# Patient Record
Sex: Female | Born: 1990 | Hispanic: No | Marital: Single | Smoking: Former smoker
Health system: Southern US, Community
[De-identification: ages and names within clinical notes are randomized; demographics above are authoritative.]

## PROBLEM LIST (undated history)

## (undated) DIAGNOSIS — K649 Unspecified hemorrhoids: Secondary | ICD-10-CM

## (undated) DIAGNOSIS — K602 Anal fissure, unspecified: Secondary | ICD-10-CM

## (undated) DIAGNOSIS — D649 Anemia, unspecified: Secondary | ICD-10-CM

## (undated) DIAGNOSIS — F32A Depression, unspecified: Secondary | ICD-10-CM

## (undated) DIAGNOSIS — Z789 Other specified health status: Secondary | ICD-10-CM

## (undated) DIAGNOSIS — K219 Gastro-esophageal reflux disease without esophagitis: Secondary | ICD-10-CM

## (undated) DIAGNOSIS — F419 Anxiety disorder, unspecified: Secondary | ICD-10-CM

## (undated) DIAGNOSIS — J939 Pneumothorax, unspecified: Secondary | ICD-10-CM

## (undated) HISTORY — DX: Anxiety disorder, unspecified: F41.9

## (undated) HISTORY — DX: Anemia, unspecified: D64.9

## (undated) HISTORY — DX: Depression, unspecified: F32.A

## (undated) HISTORY — DX: Unspecified hemorrhoids: K64.9

## (undated) HISTORY — DX: Other specified health status: Z78.9

## (undated) HISTORY — DX: Gastro-esophageal reflux disease without esophagitis: K21.9

## (undated) HISTORY — DX: Anal fissure, unspecified: K60.2

---

## 2012-06-10 DIAGNOSIS — K219 Gastro-esophageal reflux disease without esophagitis: Secondary | ICD-10-CM

## 2012-06-10 HISTORY — DX: Gastro-esophageal reflux disease without esophagitis: K21.9

## 2017-07-30 ENCOUNTER — Emergency Department
Admission: EM | Admit: 2017-07-30 | Discharge: 2017-07-30 | Disposition: A | Discharge status: Home / Self Care | Payer: Medicaid Other | Attending: Emergency Medicine | Admitting: Emergency Medicine

## 2017-07-30 DIAGNOSIS — K644 Residual hemorrhoidal skin tags: Secondary | ICD-10-CM | POA: Insufficient documentation

## 2017-07-30 DIAGNOSIS — K649 Unspecified hemorrhoids: Secondary | ICD-10-CM

## 2017-07-30 DIAGNOSIS — F17210 Nicotine dependence, cigarettes, uncomplicated: Secondary | ICD-10-CM | POA: Insufficient documentation

## 2017-07-30 MED ORDER — HYDROCORTISONE ACETATE 25 MG RE SUPP
25.00 mg | Freq: Two times a day (BID) | RECTAL | 0 refills | Status: AC
Start: 2017-07-30 — End: ?

## 2017-07-30 NOTE — Discharge Instructions (Signed)
Suppositories twice daily as needed.  Continue colace.  Increase fluids.  Follow-up with colorectal surgeon ASAP.        Hemorrhoids    You have been diagnosed with hemorrhoids.    Hemorrhoids are swollen veins around the anus (rectum). Veins are blood vessels that carry blood to the heart. Straining to move the bowels causes most hemorrhoids. Hemorrhoids often happen during pregnancy. They may also be a complication of liver disease. Hemorrhoids can be external (outside the rectum) or internal (inside the rectum). External hemorrhoids may feel like a lump near the anus. Some hemorrhoids have blood clots inside; these are very painful. You may see red blood on your toilet paper, in the toilet bowl or on your stool (poop).    Hemorrhoids are often treated with stool softeners, which make bowel movements easier. Your doctor may recommend a cream or suppository (medicine put in the rectum) to help with swelling. Your doctor may also recommend pain medicine. Use all medicines as prescribed.    Take a hot "sitz bath" for at least 15 minutes, 3-4 times a day and after each bowel movement to help the pain. It will also help to keep the anus clean. Keeping the area clean is VERY IMPORTANT to help the hemorrhoid heal.   TO TAKE A SITZ BATH: Fill the bath tub with a few inches of hot water. The water SHOULD NOT be so hot that it causes discomfort. Sit in the water. Briskly swish water against your anus to clean off any stool around the area. This will also soothe the irritated skin. Do this for about 15 minutes.    Increase the fiber or bulk in your diet. Choose foods high in fiber like fruits, vegetables and whole grain breads. Your doctor may also recommend a fiber supplement.   Don't sit on the toilet too long. No reading or relaxing!   Do not strain (push too hard)    Follow up as directed. You may be referred to a surgeon for further evaluation of your hemorrhoids.    YOU SHOULD SEEK MEDICAL ATTENTION  IMMEDIATELY, EITHER HERE OR AT THE NEAREST EMERGENCY DEPARTMENT, IF ANY OF THE FOLLOWING OCCURS:   The pain suddenly gets worse.   Repeated vomiting (throwing up), or vomiting blood or material that looks like coffee grounds.   Your stool has blood, gets very dark or looks like tar.

## 2017-07-31 NOTE — ED Provider Notes (Signed)
EMERGENCY DEPARTMENT NOTE    Physician/Midlevel provider first contact with patient: 07/30/17 2249         HISTORY OF PRESENT ILLNESS   Historian:Patient  Translator Used: No    Chief Complaint: Hemorrhoids     27 y.o. female presents for evaluation of hemorrhoids.  States she's had hemorrhoids for over a year and has been seeing a specialist in [place], [place].  Last visit about 3 weeks ago when she had 2 internal hemorrhoids banded.  States one band fell out the day of the procedure.  Doesn't know when second one fell out.  States she's supposed to follow-up with the specialist but came to the ED today because she wants a second opinion.  Reports she is taking colace as directed as well as applying an external compound cream.  States she has pain with each BM, worse today and pain lasted for some time after finishing the BM.      LMP: 07/17/17    1. Location of symptoms: rectum  2. Onset of symptoms: about 1.5 years ago  3. What was patient doing when symptoms started (Context): see above  4. Severity: moderate  5. Timing: intermittenly  6. Activities that worsen symptoms: BM  7. Activities that improve symptoms: nothing  8. Quality: tender  9. Radiation of symptoms: no  10. Associated signs and Symptoms: see above  11. Are symptoms worsening? yes  MEDICAL HISTORY     Past Medical History:  Past Medical History:   Diagnosis Date   . Anal fissure    . Hemorrhoid        Past Surgical History:  History reviewed. No pertinent surgical history.    Social History:  Social History     Social History   . Marital status: Single     Spouse name: N/A   . Number of children: N/A   . Years of education: N/A     Occupational History   . Not on file.     Social History Main Topics   . Smoking status: Current Every Day Smoker     Packs/day: 0.50   . Smokeless tobacco: Never Used   . Alcohol use Yes   . Drug use: No   . Sexual activity: Not on file     Other Topics Concern   . Not on file     Social History Narrative   . No narrative  on file       Family History:  History reviewed. No pertinent family history.    Outpatient Medication:  Discharge Medication List as of 07/30/2017 11:06 PM      CONTINUE these medications which have NOT CHANGED    Details   acetaminophen (TYLENOL) 650 MG suppository Place 650 mg rectally every 4 (four) hours as needed for Fever., Historical Med      docusate sodium (COLACE) 100 MG capsule Take 100 mg by mouth 2 (two) times daily., Historical Med      ibuprofen (ADVIL,MOTRIN) 200 MG tablet Take 200 mg by mouth every 6 (six) hours as needed for Pain., Historical Med               REVIEW OF SYSTEMS   Review of Systems   Constitutional: Negative for fever.   Genitourinary:        Hemorrhoids   Allergic/Immunologic: Negative for immunocompromised state.   All other systems reviewed and are negative.      PHYSICAL EXAM     ED Triage Vitals [07/30/17  2228]   Enc Vitals Group      BP 109/70      Heart Rate 78      Resp Rate 16      Temp 99.5 F (37.5 C)      Temp src       SpO2 99 %      Weight 71.3 kg      Height 1.676 m      Head Circumference       Peak Flow       Pain Score 0      Pain Loc       Pain Edu?       Excl. in GC?        BP 109/70   Pulse 78   Temp 99.5 F (37.5 C)   Resp 16   Ht 5\' 6"  (1.676 m)   Wt 71.3 kg   LMP 07/17/2017   SpO2 99%   BMI 25.37 kg/m       Nursing note and vitals reviewed.  Constitutional:  Well developed, well nourished. Awake & Oriented x3.  Head:  Atraumatic. Normocephalic.    Eyes:  Lids and conjunctivae are normal  ENT:  Mucous membranes are moist and intact.   Neck:  Supple. Full ROM.    Cardiovascular:  Regular rate.   Pulmonary/Chest:  No evidence of respiratory distress.   Abdominal:  Soft and non-distended. There is no tenderness.   Back:  Full ROM.   Extremities:  Moves all extremities.  Skin:  Skin is warm and dry.  No diaphoresis. No rash.   Neurological:  Alert, awake, and appropriate. Normal speech. Motor normal.  Psychiatric:  Good eye contact. Normal interaction,  affect, and behavior.    Physical Exam   Genitourinary: Rectal exam shows external hemorrhoid (not engorged or thrombosed) and internal hemorrhoid.       MEDICAL DECISION MAKING     DISCUSSION      Patient having internal pain, has not been using any internal medication/suppositories recently.  Has only been using external cream.  Will rx Anusol suppositories.  Recommend follow-up with a new colorectal surgeon for second opinion about treatment plan.  Patient feels comfortable with discharge home and verbalized understanding of all discharge instructions and follow-up care.      Vital Signs: Reviewed the patient?s vital signs.   Nursing Notes: Reviewed and utilized available nursing notes.  Medical Records Reviewed: Reviewed available past medical records.  Counseling: The emergency provider has spoken with the patient and discussed today?s findings, in addition to providing specific details for the plan of care.  Questions are answered and there is agreement with the plan.        RADIOLOGY IMAGING STUDIES      No orders to display         PULSE OXIMETRY    Oxygen Saturation by Pulse Oximetry: 99%  Interventions: none  Interpretation:  Normal    EMERGENCY DEPT. MEDICATIONS      ED Medication Orders     None          LABORATORY RESULTS    Ordered and independently interpreted AVAILABLE laboratory tests. Please see results section in chart for full details.  No results found for this or any previous visit.    CRITICAL CARE/PROCEDURES    Procedures    DIAGNOSIS      Diagnosis:  Final diagnoses:   Hemorrhoids, unspecified hemorrhoid type       Disposition:  ED  Disposition     ED Disposition Condition Date/Time Comment    Discharge  Wed Jul 30, 2017 11:06 PM [name] discharge to home/self care.    Condition at disposition: Stable          Prescriptions:  Discharge Medication List as of 07/30/2017 11:06 PM      START taking these medications    Details   hydrocortisone (ANUSOL-HC) 25 MG suppository Place 1  suppository (25 mg total) rectally 2 (two) times daily., Starting Wed 07/30/2017, Print         CONTINUE these medications which have NOT CHANGED    Details   acetaminophen (TYLENOL) 650 MG suppository Place 650 mg rectally every 4 (four) hours as needed for Fever., Historical Med      docusate sodium (COLACE) 100 MG capsule Take 100 mg by mouth 2 (two) times daily., Historical Med      ibuprofen (ADVIL,MOTRIN) 200 MG tablet Take 200 mg by mouth every 6 (six) hours as needed for Pain., Historical Med                  Dora Sims, FNP  07/31/17 [phone]

## 2018-08-14 ENCOUNTER — Emergency Department
Admission: EM | Admit: 2018-08-14 | Discharge: 2018-08-14 | Disposition: A | Discharge status: Home / Self Care | Payer: Medicaid Other | Attending: Emergency Medicine | Admitting: Emergency Medicine

## 2018-08-14 ENCOUNTER — Emergency Department: Payer: Medicaid Other

## 2018-08-14 DIAGNOSIS — N939 Abnormal uterine and vaginal bleeding, unspecified: Secondary | ICD-10-CM

## 2018-08-14 DIAGNOSIS — F17210 Nicotine dependence, cigarettes, uncomplicated: Secondary | ICD-10-CM | POA: Insufficient documentation

## 2018-08-14 LAB — CBC
Absolute NRBC: 0 10*3/uL (ref 0.00–0.00)
Hematocrit: 39.3 % (ref 34.7–43.7)
Hgb: 12.9 g/dL (ref 11.4–14.8)
MCH: 31 pg (ref 25.1–33.5)
MCHC: 32.8 g/dL (ref 31.5–35.8)
MCV: 94.5 fL (ref 78.0–96.0)
MPV: 9.8 fL (ref 8.9–12.5)
Nucleated RBC: 0 /100 WBC (ref 0.0–0.0)
Platelets: 306 10*3/uL (ref 142–346)
RBC: 4.16 10*6/uL (ref 3.90–5.10)
RDW: 13 % (ref 11–15)
WBC: 7.27 10*3/uL (ref 3.10–9.50)

## 2018-08-14 LAB — PT/INR
PT INR: 1 (ref 0.9–1.1)
PT: 12.7 s (ref 12.6–15.0)

## 2018-08-14 LAB — GFR: EGFR: >60

## 2018-08-14 LAB — COMPREHENSIVE METABOLIC PANEL
ALT: 23 U/L (ref 0–55)
AST (SGOT): 16 U/L (ref 5–34)
Albumin/Globulin Ratio: 1 (ref 0.9–2.2)
Albumin: 3.5 g/dL (ref 3.5–5.0)
Alkaline Phosphatase: 117 U/L — ABNORMAL HIGH (ref 37–106)
Anion Gap: 11 (ref 5.0–15.0)
BUN: 15 mg/dL (ref 7–19)
Bilirubin, Total: 0.5 mg/dL (ref 0.2–1.2)
CO2: 21 mEq/L — ABNORMAL LOW (ref 22–29)
Calcium: 9.4 mg/dL (ref 8.5–10.5)
Chloride: 109 mEq/L (ref 100–111)
Creatinine: 0.8 mg/dL (ref 0.6–1.0)
Globulin: 3.6 g/dL (ref 2.0–3.6)
Glucose: 80 mg/dL (ref 70–100)
Potassium: 4 mEq/L (ref 3.5–5.1)
Protein, Total: 7.1 g/dL (ref 6.0–8.3)
Sodium: 141 mEq/L (ref 136–145)

## 2018-08-14 MED ORDER — IBUPROFEN 600 MG PO TABS
600.00 mg | ORAL_TABLET | Freq: Once | ORAL | Status: AC
Start: 2018-08-14 — End: 2018-08-14
  Administered 2018-08-14: 22:00:00 600 mg via ORAL
  Filled 2018-08-14: qty 1

## 2018-08-14 MED ORDER — KETOROLAC TROMETHAMINE 30 MG/ML IJ SOLN
30.00 mg | Freq: Once | INTRAMUSCULAR | Status: DC
Start: 2018-08-14 — End: 2018-08-14

## 2018-08-14 NOTE — ED Provider Notes (Signed)
EMERGENCY DEPARTMENT NOTE    Physician/Midlevel provider first contact with patient: 08/14/18 1816         HISTORY OF PRESENT ILLNESS   Historian:Patient  Translator Used: No    Chief Complaint: Vaginal Bleeding     Mechanism of Injury:       28 y.o. female G1 para 1 A0 presents for 1 week postpartum soak 2 pads this a.m. around 7 AM upon arising from bed, and since has not soaked pad since this a.m. was told by OB/GYN to follow-up with emergency department for evaluation    Patient denies fever chills headache abdomen pain dysuria back pain      1. Location of symptoms:  2. Onset of symptoms:   3. What was patient doing when symptoms started (Context): see above  4. Severity: moderate  5. Timing:intermittent  6. Activities that worsen symptoms: nothing  7. Activities that improve symptoms: nothing  8. Quality: dark blood  9. Radiation of symptoms: no  10. Associated signs and Symptoms: see above  11. Are symptoms worsening? no  MEDICAL HISTORY     Past Medical History:  Past Medical History:   Diagnosis Date    Anal fissure     Hemorrhoid        Past Surgical History:  History reviewed. No pertinent surgical history.    Social History:  Social History     Socioeconomic History    Marital status: Single     Spouse name: Not on file    Number of children: Not on file    Years of education: Not on file    Highest education level: Not on file   Occupational History    Not on file   Social Needs    Financial resource strain: Not on file    Food insecurity:     Worry: Not on file     Inability: Not on file    Transportation needs:     Medical: Not on file     Non-medical: Not on file   Tobacco Use    Smoking status: Current Every Day Smoker     Packs/day: 0.50    Smokeless tobacco: Never Used   Substance and Sexual Activity    Alcohol use: Yes    Drug use: No    Sexual activity: Not on file   Lifestyle    Physical activity:     Days per week: Not on file     Minutes per session: Not on file    Stress:  Not on file   Relationships    Social connections:     Talks on phone: Not on file     Gets together: Not on file     Attends religious service: Not on file     Active member of club or organization: Not on file     Attends meetings of clubs or organizations: Not on file     Relationship status: Not on file    Intimate partner violence:     Fear of current or ex partner: Not on file     Emotionally abused: Not on file     Physically abused: Not on file     Forced sexual activity: Not on file   Other Topics Concern    Not on file   Social History Narrative    Not on file       Family History:  History reviewed. No pertinent family history.    Outpatient Medication:  Previous  Medications    ACETAMINOPHEN (TYLENOL) 650 MG SUPPOSITORY    Place 650 mg rectally every 4 (four) hours as needed for Fever.    DOCUSATE SODIUM (COLACE) 100 MG CAPSULE    Take 100 mg by mouth 2 (two) times daily.    HYDROCORTISONE (ANUSOL-HC) 25 MG SUPPOSITORY    Place 1 suppository (25 mg total) rectally 2 (two) times daily.    IBUPROFEN (ADVIL,MOTRIN) 200 MG TABLET    Take 200 mg by mouth every 6 (six) hours as needed for Pain.         REVIEW OF SYSTEMS   Review of Systems   Gastrointestinal: Negative for abdominal pain, constipation, diarrhea, nausea and vomiting.   Genitourinary: Negative for dysuria, flank pain, frequency, hematuria and urgency.        Vaginal bleeding   Musculoskeletal: Negative for back pain, joint pain, myalgias and neck pain.   Skin: Negative for itching and rash.   Neurological: Negative for dizziness, seizures and headaches.   Psychiatric/Behavioral: Negative for depression. The patient is not nervous/anxious.         Review of Systems   Constitutional: Negative for chills, fever and malaise/fatigue.   Respiratory: Negative for cough.    Cardiovascular: Negative for chest pain and palpitations.   Gastrointestinal: Negative for nausea and vomiting.   Genitourinary: Negative for dysuria and urgency.    Musculoskeletal: Negative for back pain and myalgias.   Neurological: Negative for dizziness, loss of consciousness, weakness and headaches.   Endo/Heme/Allergies: Does not bruise/bleed easily.   All other systems reviewed and are negative.      PHYSICAL EXAM     ED Triage Vitals [08/14/18 1743]   Enc Vitals Group      BP 127/87      Heart Rate (!) 58      Resp Rate 15      Temp 97.7 F (36.5 C)      Temp src       SpO2 98 %      Weight 71 kg      Height       Head Circumference       Peak Flow       Pain Score 0      Pain Loc       Pain Edu?       Excl. in GC?    Nursing note and vitals reviewed.  Constitutional:  Well developed, well nourished. Awake & Oriented x3.  Head:  Atraumatic. Normocephalic.    Eyes:  PERRL. EOMI. Conjunctivae are not pale.  ENT:  Mucous membranes are moist and intact. Oropharynx is clear and symmetric.  Patent airway.  Neck:  Supple. Full ROM.    Cardiovascular:  Regular rate. Regular rhythm. No murmurs, rubs, or gallops.  Pulmonary/Chest:  No evidence of respiratory distress. Clear to auscultation bilaterally.  No wheezing, rales or rhonchi.   Abdominal:  Soft and non-distended. There is no tenderness. No rebound, guarding, or rigidity.  Back:  Full ROM. Nontender.  Extremities:  No edema. No cyanosis. No clubbing. Full range of motion in all extremities.  Skin:  Skin is warm and dry.  No diaphoresis. No rash.   Neurological:  Alert, awake, and appropriate. Normal speech. Motor normal.  Psychiatric:  Good eye contact. Normal interaction, affect, and behavior.  Physical Exam   Genitourinary: No labial fusion. There is no rash, tenderness, lesion or injury on the right labia. There is no rash, tenderness, lesion or injury on the  left labia.    Vaginal bleeding present.      No vaginal discharge, erythema or tenderness.   There is bleeding in the vagina. No erythema or tenderness in the vagina.    No foreign body in the vagina.      No signs of injury in the vagina.      Genitourinary  Comments: Cervical loss closed, mild amount dark of blood in vaginal vault           MEDICAL DECISION MAKING     DISCUSSION    Well-appearing no acute distress 28 year old G1 para 1 A0 presents for 1 week postpartum soak 2 pads this a.m. around 7 AM upon arising from bed, and since has not soaked pad since this a.m. was told by OB/GYN to follow-up with emergency department for evaluation    Patient denies fever chills headache abdomen pain dysuria back pain, pain at present  CBC CMP PT/INR rule out infectious or coagulopathies metabolic concerns  Pelvic exam no acute findings cervical os closed dark blood to vaginal vault mild amount  Ultrasound pelvis rule out retained products  Ultrasound results reviewed no acute findings  Repeat abdominal exam benign  Discussed with patient need for 1 dose ibuprofen,   Vaginal bleeding follow-up with OB/GYN in 1 day call on-call provider to discuss her encounter here, strict return precautions,   Discussed patient history assessment ultrasound results with Dr. Ermelinda Das.    The patient is NOT septic.  All labs and vital signs from the current visit have been reviewed and any abnormality that is present is not due to sepsis.    Vital Signs: Reviewed the patient?s vital signs.   Nursing Notes: Reviewed and utilized available nursing notes.  Medical Records Reviewed: Reviewed available past medical records.  Counseling: The emergency provider has spoken with the patient and discussed today?s findings, in addition to providing specific details for the plan of care.  Questions are answered and there is agreement with the plan.      MIPS DOCUMENTATION        CARDIAC STUDIES    The following cardiac studies were independently interpreted by the Emergency Medicine Physician.  For full cardiac study results please see chart.        EMERGENCY IMAGING STUDIES    The following imagine studies were independently interpreted by me (emergency physician):      RADIOLOGY IMAGING STUDIES      US  Pelvic with Transvaginal (no Doppler)   Final Result    No acute process. No evidence for retained products. Small   amount of clot seen within the endometrial canal..      Darnelle Maffucci, MD    08/14/2018 9:10 PM              PULSE OXIMETRY    Oxygen Saturation by Pulse Oximetry: 98%  Interventions: none  Interpretation:  wnl    EMERGENCY DEPT. MEDICATIONS      ED Medication Orders (From admission, onward)    Start Ordered     Status Ordering Provider    08/14/18 2122 08/14/18 2121  ibuprofen (ADVIL,MOTRIN) tablet 600 mg  Once     Route: Oral  Ordered Dose: 600 mg     Last MAR action:  Given Xianna Siverling A    08/14/18 2121 08/14/18 2120    Once     Route: Intravenous  Ordered Dose: 30 mg     Discontinued Onika Gudiel A  LABORATORY RESULTS    Ordered and independently interpreted AVAILABLE laboratory tests. Please see results section in chart for full details.  Results for orders placed or performed during the hospital encounter of 08/14/18   CBC without differential   Result Value Ref Range    WBC 7.27 3.10 - 9.50 x10 3/uL    Hgb 12.9 11.4 - 14.8 g/dL    Hematocrit 84.6 96.2 - 43.7 %    Platelets 306 142 - 346 x10 3/uL    RBC 4.16 3.90 - 5.10 x10 6/uL    MCV 94.5 78.0 - 96.0 fL    MCH 31.0 25.1 - 33.5 pg    MCHC 32.8 31.5 - 35.8 g/dL    RDW 13 11 - 15 %    MPV 9.8 8.9 - 12.5 fL    Nucleated RBC 0.0 0.0 - 0.0 /100 WBC    Absolute NRBC 0.00 0.00 - 0.00 x10 3/uL   Comprehensive metabolic panel   Result Value Ref Range    Glucose 80 70 - 100 mg/dL    BUN 15 7 - 19 mg/dL    Creatinine 0.8 0.6 - 1.0 mg/dL    Sodium 952 841 - 324 mEq/L    Potassium 4.0 3.5 - 5.1 mEq/L    Chloride 109 100 - 111 mEq/L    CO2 21 (L) 22 - 29 mEq/L    Calcium 9.4 8.5 - 10.5 mg/dL    Protein, Total 7.1 6.0 - 8.3 g/dL    Albumin 3.5 3.5 - 5.0 g/dL    AST (SGOT) 16 5 - 34 U/L    ALT 23 0 - 55 U/L    Alkaline Phosphatase 117 (H) 37 - 106 U/L    Bilirubin, Total 0.5 0.2 - 1.2 mg/dL    Globulin 3.6 2.0 - 3.6 g/dL    Albumin/Globulin Ratio  1.0 0.9 - 2.2    Anion Gap 11.0 5.0 - 15.0   Prothrombin time/INR   Result Value Ref Range    PT 12.7 12.6 - 15.0 sec    PT INR 1.0 0.9 - 1.1   GFR   Result Value Ref Range    EGFR >60.0        CRITICAL CARE/PROCEDURES    Procedures    DIAGNOSIS      Diagnosis:  Final diagnoses:   Abnormal vaginal bleeding       Disposition:  ED Disposition     ED Disposition Condition Date/Time Comment    Discharge  Fri Aug 14, 2018  9:26 PM [name] discharge to home/self care.    Condition at disposition: Stable          Prescriptions:  Patient's Medications   New Prescriptions    No medications on file   Previous Medications    ACETAMINOPHEN (TYLENOL) 650 MG SUPPOSITORY    Place 650 mg rectally every 4 (four) hours as needed for Fever.    DOCUSATE SODIUM (COLACE) 100 MG CAPSULE    Take 100 mg by mouth 2 (two) times daily.    HYDROCORTISONE (ANUSOL-HC) 25 MG SUPPOSITORY    Place 1 suppository (25 mg total) rectally 2 (two) times daily.    IBUPROFEN (ADVIL,MOTRIN) 200 MG TABLET    Take 200 mg by mouth every 6 (six) hours as needed for Pain.   Modified Medications    No medications on file   Discontinued Medications    No medications on file        Peterson Ao, FNP  08/14/18 2133       Larina Bras, MD  08/14/18 2154

## 2018-08-14 NOTE — Discharge Instructions (Signed)
Abnormal Vaginal Bleeding    You have been diagnosed with abnormal vaginal bleeding.    Women normally bleed when they have their periods (menstruation). Any other bleeding is called abnormal. This may include bleeding that is heavier than normal or that happens at a time when you dont normally bleed. You may have other symptoms. These could be pelvic/abdominal (belly) pain or cramps. You could also have back pain, nausea (feeling sick to your stomach) or problems urinating (peeing). You may also feel tired or have emotional disturbances.    This is a common problem. Most women have it at some time in their lives.    The cause is usually a mild change in hormone levels. The problem often goes away on its own.    Some other causes are: Changes in birth control use, too much exercise, stress, rapid weight changes, or being overweight. It is also caused by infections, thyroid problems or injury to the vagina/cervix or uterus.    The cause can also be more serious. However, this is rare. More serious causes include cancer of the cervix or uterus. Therefore, it is important to have a follow-up appointment. See your OB-GYN doctor or primary care doctor for the follow-up.    The health care professional who saw you feels it is OK for you to go home.    You may need to return here or go to the nearest Emergency Department if you develop more symptoms. An increase in symptoms might mean you are having complications from your bleeding. These may include very heavy bleeding and/or infection.    It is important to have a follow-up. See your gynecologist or family doctor in the next few days. We may contact your doctor about this abnormal vaginal bleeding evaluation. If not, be sure to tell him or her about it.   If you don't have the right follow-up care available, tell the medical staff before you go. They can help make arrangements for you.    YOU SHOULD SEEK MEDICAL ATTENTION IMMEDIATELY, EITHER HERE OR AT THE  NEAREST EMERGENCY DEPARTMENT, IF ANY OF THE FOLLOWING OCCURS:   You have worse pain in the abdomen (belly), pelvis or back.   Very large amounts of vaginal bleeding, soaking of pads/tampons (more than one pad per hour), passage of large clots.   Fever (temperature higher than 100.5F / 38C), chills, nausea, vomiting.   You're dizzy, lightheaded, or you pass out.             1. Return immediately if worse in any way.    2. Follow up with your primary medical doctor for recheck.  Return to the emergency deparment if unable to follow up as instructed for any reason.    3. If you have any issues with follow up please call the [facility] Case Manager at [phone] between 11am and 5pm Monday-Friday for assistance.       Follow-up with OB/GYN in 1 day, please call office and speak with on-call Dr. to discuss encounter today  These return immediately to emergency department if symptoms worsen or any new problem occurs

## 2019-06-11 HISTORY — PX: OTHER SURGICAL HISTORY: SHX169

## 2019-07-20 ENCOUNTER — Emergency Department: Payer: Medicaid Other

## 2019-07-20 ENCOUNTER — Inpatient Hospital Stay
Admission: EM | Admit: 2019-07-20 | Discharge: 2019-07-22 | DRG: 143 | Disposition: A | Discharge status: Home / Self Care | Payer: Medicaid Other | Attending: Internal Medicine | Admitting: Internal Medicine

## 2019-07-20 ENCOUNTER — Inpatient Hospital Stay: Payer: Medicaid Other

## 2019-07-20 DIAGNOSIS — J9311 Primary spontaneous pneumothorax: Principal | ICD-10-CM | POA: Diagnosis present

## 2019-07-20 DIAGNOSIS — R0902 Hypoxemia: Secondary | ICD-10-CM | POA: Diagnosis present

## 2019-07-20 DIAGNOSIS — F17210 Nicotine dependence, cigarettes, uncomplicated: Secondary | ICD-10-CM | POA: Diagnosis present

## 2019-07-20 DIAGNOSIS — E669 Obesity, unspecified: Secondary | ICD-10-CM | POA: Diagnosis present

## 2019-07-20 DIAGNOSIS — Z6836 Body mass index (BMI) 36.0-36.9, adult: Secondary | ICD-10-CM

## 2019-07-20 DIAGNOSIS — J939 Pneumothorax, unspecified: Secondary | ICD-10-CM | POA: Diagnosis present

## 2019-07-20 DIAGNOSIS — R0602 Shortness of breath: Secondary | ICD-10-CM

## 2019-07-20 LAB — COVID-19 (SARS-COV-2): SARS CoV 2 Overall Result: NEGATIVE

## 2019-07-20 LAB — CBC AND DIFFERENTIAL
Absolute NRBC: 0 10*3/uL (ref 0.00–0.00)
Basophils Absolute Automated: 0.04 10*3/uL (ref 0.00–0.08)
Basophils Automated: 0.8 %
Eosinophils Absolute Automated: 0.09 10*3/uL (ref 0.00–0.44)
Eosinophils Automated: 1.8 %
Hematocrit: 38.2 % (ref 34.7–43.7)
Hgb: 12.5 g/dL (ref 11.4–14.8)
Immature Granulocytes Absolute: 0.01 10*3/uL (ref 0.00–0.07)
Immature Granulocytes: 0.2 %
Lymphocytes Absolute Automated: 1.84 10*3/uL (ref 0.42–3.22)
Lymphocytes Automated: 36.4 %
MCH: 30.4 pg (ref 25.1–33.5)
MCHC: 32.7 g/dL (ref 31.5–35.8)
MCV: 92.9 fL (ref 78.0–96.0)
MPV: 9.3 fL (ref 8.9–12.5)
Monocytes Absolute Automated: 0.33 10*3/uL (ref 0.21–0.85)
Monocytes: 6.5 %
Neutrophils Absolute: 2.74 10*3/uL (ref 1.10–6.33)
Neutrophils: 54.3 %
Nucleated RBC: 0 /100 WBC (ref 0.0–0.0)
Platelets: 277 10*3/uL (ref 142–346)
RBC: 4.11 10*6/uL (ref 3.90–5.10)
RDW: 12 % (ref 11–15)
WBC: 5.05 10*3/uL (ref 3.10–9.50)

## 2019-07-20 LAB — GFR: EGFR: >60

## 2019-07-20 LAB — COMPREHENSIVE METABOLIC PANEL
ALT: 19 U/L (ref 0–55)
AST (SGOT): 16 U/L (ref 5–34)
Albumin/Globulin Ratio: 1.4 (ref 0.9–2.2)
Albumin: 4 g/dL (ref 3.5–5.0)
Alkaline Phosphatase: 104 U/L (ref 37–106)
Anion Gap: 7 (ref 5.0–15.0)
BUN: 10 mg/dL (ref 7.0–19.0)
Bilirubin, Total: 0.6 mg/dL (ref 0.2–1.2)
CO2: 26 mEq/L (ref 22–29)
Calcium: 8.9 mg/dL (ref 8.5–10.5)
Chloride: 108 mEq/L (ref 100–111)
Creatinine: 0.8 mg/dL (ref 0.6–1.0)
Globulin: 2.9 g/dL (ref 2.0–3.6)
Glucose: 82 mg/dL (ref 70–100)
Potassium: 4.2 mEq/L (ref 3.5–5.1)
Protein, Total: 6.9 g/dL (ref 6.0–8.3)
Sodium: 141 mEq/L (ref 136–145)

## 2019-07-20 LAB — HEMOLYSIS INDEX: Hemolysis Index: 6 (ref 0–18)

## 2019-07-20 LAB — TROPONIN I: Troponin I: <0.01 ng/mL (ref 0.00–0.05)

## 2019-07-20 LAB — IHS D-DIMER: D-Dimer: 0.4 ug/mL FEU (ref 0.00–0.50)

## 2019-07-20 LAB — HCG QUANTITATIVE: hCG, Quant.: <1.2

## 2019-07-20 MED ORDER — LIDOCAINE HCL 1 % IJ SOLN
10.00 mL | Freq: Once | INTRAMUSCULAR | Status: AC
Start: 2019-07-20 — End: 2019-07-20
  Administered 2019-07-20: 15:00:00 10 mL via INTRADERMAL

## 2019-07-20 MED ORDER — OXYCODONE HCL 5 MG PO TABS
5.0000 mg | ORAL_TABLET | ORAL | Status: DC | PRN
Start: 2019-07-20 — End: 2019-07-22
  Administered 2019-07-20 – 2019-07-22 (×8): 5 mg via ORAL
  Filled 2019-07-20 (×8): qty 1

## 2019-07-20 MED ORDER — LIDOCAINE VISCOUS HCL 2 % MT SOLN
10.00 mL | Freq: Once | OROMUCOSAL | Status: DC
Start: 2019-07-20 — End: 2019-07-20

## 2019-07-20 MED ORDER — ACETAMINOPHEN 325 MG PO TABS
650.0000 mg | ORAL_TABLET | Freq: Four times a day (QID) | ORAL | Status: DC | PRN
Start: 2019-07-20 — End: 2019-07-22
  Administered 2019-07-20: 650 mg via ORAL
  Filled 2019-07-20: qty 2

## 2019-07-20 MED ORDER — ALUM & MAG HYDROXIDE-SIMETH 200-200-20 MG/5ML PO SUSP
30.00 mL | Freq: Once | ORAL | Status: DC
Start: 2019-07-20 — End: 2019-07-20

## 2019-07-20 MED ORDER — MORPHINE SULFATE 4 MG/ML IJ/IV SOLN (WRAP)
4.0000 mg | Status: DC | PRN
Start: 2019-07-20 — End: 2019-07-22
  Administered 2019-07-21 – 2019-07-22 (×5): 4 mg via INTRAVENOUS
  Filled 2019-07-20 (×5): qty 1

## 2019-07-20 MED ORDER — LIDOCAINE-EPINEPHRINE 1 %-1:100000 IJ SOLN
20.00 mL | Freq: Once | INTRAMUSCULAR | Status: AC
Start: 2019-07-20 — End: 2019-07-20
  Administered 2019-07-20: 15:00:00 20 mL via INTRADERMAL
  Filled 2019-07-20: qty 20

## 2019-07-20 MED ORDER — FENTANYL CITRATE (PF) 50 MCG/ML IJ SOLN (WRAP)
50.00 ug | Freq: Once | INTRAMUSCULAR | Status: AC
Start: 2019-07-20 — End: 2019-07-20
  Administered 2019-07-20: 15:00:00 50 ug via INTRAVENOUS
  Filled 2019-07-20: qty 2

## 2019-07-20 MED ORDER — ONDANSETRON HCL 4 MG/2ML IJ SOLN
4.00 mg | INTRAMUSCULAR | Status: DC | PRN
Start: 2019-07-20 — End: 2019-07-22
  Administered 2019-07-20 – 2019-07-21 (×3): 4 mg via INTRAVENOUS
  Filled 2019-07-20 (×3): qty 2

## 2019-07-20 MED ORDER — MORPHINE SULFATE 4 MG/ML IJ/IV SOLN (WRAP)
4.0000 mg | Freq: Once | Status: AC
Start: 2019-07-20 — End: 2019-07-20
  Administered 2019-07-20: 17:00:00 4 mg via INTRAVENOUS
  Filled 2019-07-20: qty 1

## 2019-07-20 NOTE — ED Notes (Signed)
Bed: CR4  Expected date:   Expected time:   Means of arrival:   Comments:

## 2019-07-20 NOTE — H&P (Signed)
Patient Type: I     ATTENDING PHYSICIAN: Hulen Skains, MD     CHIEF COMPLAINT:  Chest pain and shortness of breath.     HISTORY OF PRESENT ILLNESS:  [name] is a 29 year old Caucasian lady who was in her usual state of  health until the morning of admission when she was brushing her teeth, when  she developed acute onset of chest pain and shortness of breath.  She  initially thought this could be due to reflux symptoms.  She took some  Alka-Seltzer and this then did not result in resolution of her symptoms  and, therefore, she presented to the emergency room for further evaluation.   On presentation to the ER, she was noted to have a primary spontaneous  pneumothorax on the right requiring chest tube placement.     PAST MEDICAL HISTORY:  1.  History of anal fissure.  2.  Hemorrhoids.      PAST SURGICAL HISTORY:  None provided.     SOCIAL HISTORY:  The patient lives with her grandmother.  She has not been in touch with her  parents for several years.  She has an 68-month-old daughter who lives with  her aunt.  She had a history of tobacco use in the past, she quit about 2  years ago.  She smoked half a packet of cigarettes a day since the age of  50, making it a 40-pack-year history of smoking.  She currently does not  actively use tobacco products.  She also quit alcohol use in the past.  She  denies recreational drug use.  She is currently enrolled in an  undergraduate study.     ALLERGIES AND DRUG SENSITIVITIES:  None.     MEDICATIONS PRIOR TO ADMISSION:  Include:  1.  Oral liquid vitamin D.    2.  Supplements.  3.  Prenatal vitamins.     REVIEW OF SYSTEMS:  A 10-point review of system was done.  Each system was reviewed.  The only  findings were prior history of menstrual cramps, but never been diagnosed  with endometriosis.     PHYSICAL EXAMINATION:  GENERAL:  Physical exam revealed an obese lady who appeared uncomfortable.  HEAD AND NECK EXAM:  Unremarkable.  LUNGS:  Auscultation of the lungs revealed  clear lung fields.  HEART:  Heart sounds 1 and 2 were heard, no gallops or rubs.  ABDOMINAL EXAM:  Revealed soft abdomen and bowel sounds are present.  No  masses are palpable.  EXTREMITIES:  Reveal no edema.  NEUROLOGIC EXAM:  Nonfocal.     LABORATORY DATA AND IMAGING STUDIES:  Chest x-ray done by portable technique revealed a moderately large right  pneumothorax.  Followup chest x-ray revealed resolution of the pneumothorax  after chest tube insertion.  Sodium 141, potassium 4.2, chloride of 108,  CO2 of 26, BUN of 10, creatinine of 0.8.  Hemoglobin 12.5, hematocrit 38.2,  white blood cell count of 5,050, platelet count of 277,000.  Glucose of 82.   Troponin was 0.01.  Beta hCG is less than 1.2.  COVID-19 test is negative.   Other liver function tests are unremarkable.     ASSESSMENT AND PLAN:  This is a young lady who presents with a spontaneous pneumothorax, most  likely a primary spontaneous pneumothorax.  Would obtain urine rapid drug  test .  Will also obtain a D-dimer to  rule out pulmonary embolism, which are possible causes of pneumothorax.   She does not appear to  have the build and the physique to suggest  Ehlers-Danlos syndrome.  A chest tube has been placed with resolution of  the pneumothorax.  Analgesics will be provided for pain relief.  Pulmonary  consultation to assist with management and manage chest tube.  Further  recommendations will be based on hospital course.  Plan as outlined has  been discussed with the patient at the bedside.           D:  07/20/2019 18:19 PM by Dr. Myra Gianotti. Vickie Mount, MD [phone])  T:  07/20/2019 18:39 PM by NTS      (Conf: [id number]) (Doc ID: [id number])

## 2019-07-20 NOTE — ED Triage Notes (Signed)
[name] is a 29 y.o. female c/o SOB and chest discomfort that began today. No other complaints. CXR in waiting room showed pneumothorax.

## 2019-07-20 NOTE — ED Triage Notes (Signed)
EMERGENCY DEPARTMENT PIT NOTE    Patient Name: [name] [name] has had a rapid medical screening evaluation initiated by myself for the chief complaint of chest pain.  History of reflux, this episode atypical as it was not relieved by Alka-Seltzer, worse with breathing.    Vitals: There were no vitals taken for this visit.  Pertinent brief exam: Alert, oriented, ambulatory  Prelim orders: CP panel, GI cocktail    Patient advised to remain in the ED until further evaluation can be performed. Patient instructed to notify staff of any changes in condition while waiting.    I am not the sole provider and this assessment is only an initial evaluation prior to full evaluation to expedite care.

## 2019-07-20 NOTE — ED Notes (Signed)
Bed: CR4  Expected date:   Expected time:   Means of arrival:   Comments:  Special educational needs teacher

## 2019-07-20 NOTE — Consults (Signed)
Pulmonary Critical Care CONSULTATION    Date Time: 07/20/19 5:51 PM  Patient Name: [name],[name]  Requesting Physician: Gwyndolyn Kaufman, MD      Assesment:     .  Primary spontaneous pneumothorax  .  Chest pain  .  Hypoxemia    Plan:     .  Primary spontaneous pneumothorax, status post chest tube placement, will order noncontrast CT of the chest, will repeat chest x-ray in the morning  .  Pain medications  .  Continue oxygen    Discussed with Dr. Sharla Kidney in emergency room  Discussed with patient    History:   [name] is a 29 y.o. female with no significant past medical history came to emergency room with sudden onset of shortness of breath and some chest discomfort.  Chest x-ray showed right-sided pneumothorax.  She underwent chest tube placement in emergency room.  I was asked by emergency room physician to see the patient and provide further recommendations.  At present she is awake alert she is feeling better however still complaining of some chest discomfort at chest tube site.      Past Medical History:     Past Medical History:   Diagnosis Date    Anal fissure     Hemorrhoid        Past Surgical History:   History reviewed. No pertinent surgical history.    Family History:   History reviewed. No pertinent family history.    Social History:     Social History     Socioeconomic History    Marital status: Single     Spouse name: Not on file    Number of children: Not on file    Years of education: Not on file    Highest education level: Not on file   Occupational History    Not on file   Social Needs    Financial resource strain: Not on file    Food insecurity     Worry: Not on file     Inability: Not on file    Transportation needs     Medical: Not on file     Non-medical: Not on file   Tobacco Use    Smoking status: Current Every Day Smoker     Packs/day: 0.50    Smokeless tobacco: Never Used   Substance and Sexual Activity    Alcohol use: Yes    Drug use: No    Sexual  activity: Not on file   Lifestyle    Physical activity     Days per week: Not on file     Minutes per session: Not on file    Stress: Not on file   Relationships    Social connections     Talks on phone: Not on file     Gets together: Not on file     Attends religious service: Not on file     Active member of club or organization: Not on file     Attends meetings of clubs or organizations: Not on file     Relationship status: Not on file    Intimate partner violence     Fear of current or ex partner: Not on file     Emotionally abused: Not on file     Physically abused: Not on file     Forced sexual activity: Not on file   Other Topics Concern    Not on file   Social History Narrative  Not on file       Allergies:   No Known Allergies    Hospital Medications:       Home Medications:   (Not in a hospital admission)      Review of Systems:   General ROS: negative for - chills, fatigue, fever or night sweats  Ophthalmic ROS: negative for - double vision, excessive tearing, itchy eyes or photophobia  ENT ROS: negative for - headaches, nasal congestion, sore throat or vertigo  Respiratory ROS: negative for - cough, hemoptysis, orthopnea, positive for shortness of breath and chest discomfort, no wheezing  Cardiovascular ROS: negative for - chest pain, edema, orthopnea, rapid heart rate   Gastrointestinal ROS: negative for - abdominal pain, blood in stools, constipation, diarrhea, heartburn or nausea/vomiting  Musculoskeletal ROS: negative for - muscle pain  Neurological ROS: negative for - confusion, dizziness, headaches, speech problems, visual changes or weakness  Dermatological ROS: negative for dry skin, pruritus and rash    Physical Exam:   BP 126/75    Pulse 71    Temp 98.4 F (36.9 C) (Oral)    Resp 21    Ht 1.676 m (5\' 6" )    Wt 102.5 kg (225 lb 14.4 oz)    SpO2 100%    BMI 36.46 kg/m     Intake and Output Summary (Last 24 hours) at Date Time  No intake or output data in the 24 hours ending 07/20/19  1751    General appearance - alert,  and in no distress  Mental status - alert, oriented to person, place, and time  Eyes - pupils equal and reactive, extraocular eye movements intact  Ears - no external ear lesions seen  Nose - no nasal discharge   Mouth - clear oral mucosa, moist   Neck - supple, no significant adenopathy  Chest - clear to auscultation, no wheezes, rales or rhonchi, symmetric air entry  Heart - normal rate, regular rhythm, normal S1, S2, no rubs, clicks or gallops  Abdomen - soft, nontender, nondistended, no masses or organomegaly  Neurological - alert, oriented, normal speech, no focal findings or movement disorder noted  Musculoskeletal - no joint swelling or tenderness  Extremities -  no pedal edema, no clubbing or cyanosis  Skin - normal coloration, no rashes    Labs Reviewed:     Results     Procedure Component Value Units Date/Time    Troponin I [[id number]] Collected: 07/20/19 1441    Specimen: Blood Updated: 07/20/19 1514     Troponin I <0.01 ng/mL     Beta HCG, Quant, Serum [[id number]] Collected: 07/20/19 1441     Updated: 07/20/19 1514     hCG, Quant. <1.2    Comprehensive metabolic panel [[id number]] Collected: 07/20/19 1441    Specimen: Blood Updated: 07/20/19 1508     Glucose 82 mg/dL      BUN 08.6 mg/dL      Creatinine 0.8 mg/dL      Sodium 578 mEq/L      Potassium 4.2 mEq/L      Chloride 108 mEq/L      CO2 26 mEq/L      Calcium 8.9 mg/dL      Protein, Total 6.9 g/dL      Albumin 4.0 g/dL      AST (SGOT) 16 U/L      ALT 19 U/L      Alkaline Phosphatase 104 U/L      Bilirubin, Total 0.6 mg/dL  Globulin 2.9 g/dL      Albumin/Globulin Ratio 1.4     Anion Gap 7.0    Hemolysis index [[id number]] Collected: 07/20/19 1441     Updated: 07/20/19 1508     Hemolysis Index 6    GFR [[id number]] Collected: 07/20/19 1441     Updated: 07/20/19 1508     EGFR >60.0    CBC and differential [[id number]] Collected: 07/20/19 1441    Specimen: Blood Updated: 07/20/19 1453     WBC 5.05 x10 3/uL      Hgb  12.5 g/dL      Hematocrit 28.4 %      Platelets 277 x10 3/uL      RBC 4.11 x10 6/uL      MCV 92.9 fL      MCH 30.4 pg      MCHC 32.7 g/dL      RDW 12 %      MPV 9.3 fL      Neutrophils 54.3 %      Lymphocytes Automated 36.4 %      Monocytes 6.5 %      Eosinophils Automated 1.8 %      Basophils Automated 0.8 %      Immature Granulocytes 0.2 %      Nucleated RBC 0.0 /100 WBC      Neutrophils Absolute 2.74 x10 3/uL      Lymphocytes Absolute Automated 1.84 x10 3/uL      Monocytes Absolute Automated 0.33 x10 3/uL      Eosinophils Absolute Automated 0.09 x10 3/uL      Basophils Absolute Automated 0.04 x10 3/uL      Immature Granulocytes Absolute 0.01 x10 3/uL      Absolute NRBC 0.00 x10 3/uL     COVID-19 (SARS-COV-2) ( Rapid) [[id number]] Collected: 07/20/19 1448    Specimen: Nasopharyngeal Swab from Nasopharynx Updated: 07/20/19 1450     Purpose of COVID testing Screening     SARS-CoV-2 Specimen Source Nasopharyngeal    Narrative:      o Collect and clearly label specimen type:  o Upper respiratory specimen: One Nasopharyngeal Dry Swab NO  Transport Media.  o Hand deliver to laboratory ASAP  Indication for testing->Extended care facility admission to  semi private room          Rads:   Radiological Procedure reviewed.   Radiology Results (24 Hour)     Procedure Component Value Units Date/Time    XR Chest  AP Portable [[id number]] Collected: 07/20/19 1602    Order Status: Completed Updated: 07/20/19 1606    Narrative:      CLINICAL INDICATION: Status post chest tube placement    COMPARISON: 07/20/2019 1404 hours    INTERPRETATION: Single portable frontal view of the chest obtained.  Interval placement of a right-sided chest tube. No residual pneumothorax  identified. Lungs are clear and sulci sharp. Mediastinum midline with  normal cardiomediastinal contour. Diminished lung volumes. Right apex is  scattered by overlying cardiac leads..      Impression:       Status post right chest tube placement with resolution  of  right-sided pneumothorax.    Mitali Bapna, MD   07/20/2019 4:04 PM    Chest AP Portable [[id number]] Collected: 07/20/19 1415    Order Status: Completed Updated: 07/20/19 1420    Narrative:      INDICATION: Chest pain.    TECHNIQUE: One view.    FINDINGS: There is a moderate to large right pneumothorax. No  mediastinal  shift is noted. The lungs are free of infiltrate. No pleural  effusions are noted. The heart is not enlarged.      Impression:       Moderate large right pneumothorax.    These results were given to PA Reck on 07/20/2019 at 2:15 PM.    Merri Ray, MD   07/20/2019 2:18 PM          Signed by: Warden Fillers

## 2019-07-20 NOTE — ED Provider Notes (Signed)
EMERGENCY DEPARTMENT HISTORY AND PHYSICAL EXAM     None        Date: 07/20/2019  Patient Name: [name]    History of Presenting Illness and Plan     Chief Complaint   Patient presents with    Shortness of Breath       History Provided By: patient   Chief Complaint: chest pain  Duration: this morning  Timing:  constant  Location: cardiovascular  Quality: uncomfortable  Severity: severe    Associated symptoms and pertinent negative listed in ROS.      PCP: Pcp, None, MD  SPECIALISTS:    No current facility-administered medications for this encounter.      Current Outpatient Medications   Medication Sig Dispense Refill    acetaminophen (TYLENOL) 650 MG suppository Place 650 mg rectally every 4 (four) hours as needed for Fever.      docusate sodium (COLACE) 100 MG capsule Take 100 mg by mouth 2 (two) times daily.      hydrocortisone (ANUSOL-HC) 25 MG suppository Place 1 suppository (25 mg total) rectally 2 (two) times daily. 10 suppository 0    ibuprofen (ADVIL,MOTRIN) 200 MG tablet Take 200 mg by mouth every 6 (six) hours as needed for Pain.         Past History     Past Medical History:  Past Medical History:   Diagnosis Date    Anal fissure     Hemorrhoid        Past Surgical History:  History reviewed. No pertinent surgical history.    Family History:  History reviewed. No pertinent family history.    Social History:  Social History     Tobacco Use    Smoking status: Current Every Day Smoker     Packs/day: 0.50    Smokeless tobacco: Never Used   Substance Use Topics    Alcohol use: Yes    Drug use: No       Allergies:  No Known Allergies    Review of Systems     Review of Systems   Constitutional: Negative for fever.   HENT: Negative for congestion.    Eyes: Negative for pain.   Respiratory: Positive for shortness of breath. Negative for cough.    Cardiovascular: Positive for chest pain.   Gastrointestinal: Negative for abdominal pain, diarrhea, nausea and vomiting.   Musculoskeletal:  Negative for back pain.   Skin: Negative for rash.   Neurological: Negative for syncope.   Psychiatric/Behavioral: Negative for agitation.     Physical Exam   BP 126/75    Pulse 71    Temp 98.4 F (36.9 C) (Oral)    Resp 21    Ht 5\' 6"  (1.676 m)    Wt 102.5 kg    SpO2 100%    BMI 36.46 kg/m     Physical Exam  Vitals signs and nursing note reviewed.   Constitutional:       Appearance: She is well-developed.   HENT:      Head: Normocephalic.   Eyes:      Conjunctiva/sclera: Conjunctivae normal.   Neck:      Musculoskeletal: Neck supple.   Cardiovascular:      Rate and Rhythm: Normal rate and regular rhythm.   Pulmonary:      Breath sounds: Decreased breath sounds present.      Comments: Absent breath sounds to right lateral lung.  Abdominal:      Palpations: Abdomen is soft.  Tenderness: There is no abdominal tenderness.   Musculoskeletal:         General: No deformity.   Skin:     General: Skin is warm.   Neurological:      Mental Status: She is alert.        Diagnostic Study Results     Labs -     Results     Procedure Component Value Units Date/Time    Troponin I [[id number]] Collected: 07/20/19 1441    Specimen: Blood Updated: 07/20/19 1514     Troponin I <0.01 ng/mL     Beta HCG, Quant, Serum [[id number]] Collected: 07/20/19 1441     Updated: 07/20/19 1514     hCG, Quant. <1.2    Comprehensive metabolic panel [[id number]] Collected: 07/20/19 1441    Specimen: Blood Updated: 07/20/19 1508     Glucose 82 mg/dL      BUN 08.6 mg/dL      Creatinine 0.8 mg/dL      Sodium 578 mEq/L      Potassium 4.2 mEq/L      Chloride 108 mEq/L      CO2 26 mEq/L      Calcium 8.9 mg/dL      Protein, Total 6.9 g/dL      Albumin 4.0 g/dL      AST (SGOT) 16 U/L      ALT 19 U/L      Alkaline Phosphatase 104 U/L      Bilirubin, Total 0.6 mg/dL      Globulin 2.9 g/dL      Albumin/Globulin Ratio 1.4     Anion Gap 7.0    Hemolysis index [[id number]] Collected: 07/20/19 1441     Updated: 07/20/19 1508     Hemolysis Index 6    GFR [[id number]]  Collected: 07/20/19 1441     Updated: 07/20/19 1508     EGFR >60.0    CBC and differential [[id number]] Collected: 07/20/19 1441    Specimen: Blood Updated: 07/20/19 1453     WBC 5.05 x10 3/uL      Hgb 12.5 g/dL      Hematocrit 03.4 %      Platelets 277 x10 3/uL      RBC 4.11 x10 6/uL      MCV 92.9 fL      MCH 30.4 pg      MCHC 32.7 g/dL      RDW 12 %      MPV 9.3 fL      Neutrophils 54.3 %      Lymphocytes Automated 36.4 %      Monocytes 6.5 %      Eosinophils Automated 1.8 %      Basophils Automated 0.8 %      Immature Granulocytes 0.2 %      Nucleated RBC 0.0 /100 WBC      Neutrophils Absolute 2.74 x10 3/uL      Lymphocytes Absolute Automated 1.84 x10 3/uL      Monocytes Absolute Automated 0.33 x10 3/uL      Eosinophils Absolute Automated 0.09 x10 3/uL      Basophils Absolute Automated 0.04 x10 3/uL      Immature Granulocytes Absolute 0.01 x10 3/uL      Absolute NRBC 0.00 x10 3/uL     COVID-19 (SARS-COV-2) ([facility] Rapid) [[id number]] Collected: 07/20/19 1448    Specimen: Nasopharyngeal Swab from Nasopharynx Updated: 07/20/19 1450     Purpose of COVID testing Screening     SARS-CoV-2  Specimen Source Nasopharyngeal    Narrative:      o Collect and clearly label specimen type:  o Upper respiratory specimen: One Nasopharyngeal Dry Swab NO  Transport Media.  o Hand deliver to laboratory ASAP  Indication for testing->Extended care facility admission to  semi private room          Radiologic Studies -   Radiology Results (24 Hour)     Procedure Component Value Units Date/Time    XR Chest  AP Portable [[id number]] Collected: 07/20/19 1602    Order Status: Completed Updated: 07/20/19 1606    Narrative:      CLINICAL INDICATION: Status post chest tube placement    COMPARISON: 07/20/2019 1404 hours    INTERPRETATION: Single portable frontal view of the chest obtained.  Interval placement of a right-sided chest tube. No residual pneumothorax  identified. Lungs are clear and sulci sharp. Mediastinum midline with  normal  cardiomediastinal contour. Diminished lung volumes. Right apex is  scattered by overlying cardiac leads..      Impression:       Status post right chest tube placement with resolution of  right-sided pneumothorax.    Mitali Bapna, MD   07/20/2019 4:04 PM    Chest AP Portable [[id number]] Collected: 07/20/19 1415    Order Status: Completed Updated: 07/20/19 1420    Narrative:      INDICATION: Chest pain.    TECHNIQUE: One view.    FINDINGS: There is a moderate to large right pneumothorax. No  mediastinal shift is noted. The lungs are free of infiltrate. No pleural  effusions are noted. The heart is not enlarged.      Impression:       Moderate large right pneumothorax.    These results were given to PA Reck on 07/20/2019 at 2:15 PM.    Merri Ray, MD   07/20/2019 2:18 PM      .    Medical Decision Making   I am the first provider for this patient.    I reviewed the vital signs, available nursing notes, past medical history, past surgical history, family history and social history.    Vital Signs-Reviewed the patient's vital signs.     Patient Vitals for the past 12 hrs:   BP Temp Pulse Resp   07/20/19 1720 126/75 -- 71 21   07/20/19 1630 119/79 -- 63 20   07/20/19 1530 115/56 -- 67 18   07/20/19 1520 120/83 -- 72 (!) 23   07/20/19 1430 127/75 -- 74 --   07/20/19 1344 111/69 98.4 F (36.9 C) (!) 52 17       Pulse Oximetry Analysis - Normal     EKG:  Interpreted by the EP.   Time Interpreted: 242PM   Rate: 62   Interpretation: NSR, normal axis, normal intervals, no ST abnormalities, normal EKG    Comparison: no prior    Procedures: Chest Tube    Date/Time: 07/20/2019 3:58 PM  Performed by: Suzy Bouchard, MD  Authorized by: Suzy Bouchard, MD     Consent:     Consent obtained:  Written    Consent given by:  Patient    Risks discussed:  Bleeding, incomplete drainage, damage to surrounding structures, infection, nerve damage and pain    Alternatives discussed:  No treatment  Pre-procedure details:     Skin preparation:   ChloraPrep  Anesthesia (see MAR for exact dosages):     Anesthesia method:  Local infiltration    Local anesthetic:  Lidocaine 2% WITH epi  Procedure details:     Placement location:  R lateral    Tube size (Fr):  20    Dissection instrument:  Kelly clamp    Tube connected to:  Water seal    Drainage characteristics:  Air only    Suture material:  0 silk    Dressing:  4x4 sterile gauze and Xeroform gauze  Post-procedure details:     Post-insertion x-ray findings: tube in good position      Post-insertion x-ray findings comment:  Post-insertion visualized with x-ray    Patient tolerance of procedure:  Tolerated well, no immediate complications        Old Medical Records:    ED Course:       322PM - Sound given sign out. Dr. Gwynneth Albright consulted.       Provider Note: 57F with PMh of smoking presents with R sided chest pain since this morning, no fever/cough/vomiting/diarrhea, VSS, cxr with pneumothorax, plan for chest tube and admission.     Dr. Suzy Bouchard is the primary emergency doctor of record.        Critical Care Time:    For Hospitalized Patients:    1. Hospitalization Decision Time:  The decision to admit this patient was made by the emergency provider at 322PM[time] on 07/20/2019     2. Aspirin: Aspirin was not given because the patient did not present with a stroke at the time of their Emergency Department evaluation.    3. Core Measures    Diagnosis     Clinical Impression:   1. Pneumothorax        Treatment Plan:   ED Disposition     ED Disposition Condition Date/Time Comment    Admit  Tue Jul 20, 2019  3:22 PM Admitting Physician: Mahlon Gammon, [place] [[id number]]   Service:: Medicine [106]   Estimated Length of Stay: > or = to 2 midnights   Tentative Discharge Plan?: Home or Self Care [1]   Does patient need telemetry?: Yes   Telemetry type (separate Telemetry order is also required):: Medical telemetry                This note was generated by the Epic EMR system/ Dragon speech recognition and may contain  inherent errors or omissions not intended by the user. Grammatical errors, random word insertions, deletions and pronoun errors  are occasional consequences of this technology due to software limitations. Not all errors are caught or corrected. If there are questions or concerns about the content of this note or information contained within the body of this dictation they should be addressed directly with the author for clarification.  _______________________________       Suzy Bouchard, MD  07/20/19 [phone]

## 2019-07-20 NOTE — ED Notes (Signed)
[facility] EMERGENCY DEPARTMENT  ED NURSING NOTE FOR THE RECEIVING INPATIENT NURSE   ED NURSE Marcelino Scot [phone]   ED CHARGE RN [phone]   ADMISSION INFORMATION   Naevia Unterreiner is a 29 y.o. female admitted with a diagnosis of:    1. Pneumothorax         Isolation: None   Allergies: Patient has no known allergies.   Holding Orders confirmed? Yes   Belongings Documented? Yes   Home medications sent to pharmacy confirmed? No   NURSING CARE   Patient Comes From:   Mental Status: Home Independent  alert and oriented   ADL: Independent with all ADLs   Ambulation: no difficulty   Pertinent Information  and Safety Concerns: Pt has a chest tube on R side. ALert and oriented.     VITAL SIGNS   Time BP Temp Pulse Resp SpO2   1800 122/72 98.4 60 21 100   CT / NIH   CT Head ordered on this patient?  No   NIH/Dysphagia assessment done prior to admission? No   PERSONAL PROTECTIVE EQUIPMENT   Gloves and Procedure Mask with Shield   LAB RESULTS   Labs Reviewed   COVID-19 (SARS-COV-2)    Narrative:     o Collect and clearly label specimen type:  o Upper respiratory specimen: One Nasopharyngeal Dry Swab NO  Transport Media.  o Hand deliver to laboratory ASAP  Indication for testing->Extended care facility admission to  semi private room   COMPREHENSIVE METABOLIC PANEL   CBC AND DIFFERENTIAL   TROPONIN I   HEMOLYSIS INDEX   GFR   HCG QUANTITATIVE   IHS D-DIMER   RAPID DRUG SCREEN, URINE          Ticket to Ride Printed: Yes

## 2019-07-21 ENCOUNTER — Inpatient Hospital Stay: Payer: Medicaid Other

## 2019-07-21 ENCOUNTER — Encounter: Payer: Self-pay | Admitting: Internal Medicine

## 2019-07-21 LAB — CBC AND DIFFERENTIAL
Absolute NRBC: 0 10*3/uL (ref 0.00–0.00)
Basophils Absolute Automated: 0.02 10*3/uL (ref 0.00–0.08)
Basophils Automated: 0.2 %
Eosinophils Absolute Automated: 0.03 10*3/uL (ref 0.00–0.44)
Eosinophils Automated: 0.3 %
Hematocrit: 38.4 % (ref 34.7–43.7)
Hgb: 12.4 g/dL (ref 11.4–14.8)
Immature Granulocytes Absolute: 0.03 10*3/uL (ref 0.00–0.07)
Immature Granulocytes: 0.3 %
Lymphocytes Absolute Automated: 2.11 10*3/uL (ref 0.42–3.22)
Lymphocytes Automated: 23.2 %
MCH: 30.2 pg (ref 25.1–33.5)
MCHC: 32.3 g/dL (ref 31.5–35.8)
MCV: 93.4 fL (ref 78.0–96.0)
MPV: 10 fL (ref 8.9–12.5)
Monocytes Absolute Automated: 0.86 10*3/uL — ABNORMAL HIGH (ref 0.21–0.85)
Monocytes: 9.5 %
Neutrophils Absolute: 6.05 10*3/uL (ref 1.10–6.33)
Neutrophils: 66.5 %
Nucleated RBC: 0 /100 WBC (ref 0.0–0.0)
Platelets: 287 10*3/uL (ref 142–346)
RBC: 4.11 10*6/uL (ref 3.90–5.10)
RDW: 12 % (ref 11–15)
WBC: 9.1 10*3/uL (ref 3.10–9.50)

## 2019-07-21 LAB — COMPREHENSIVE METABOLIC PANEL
ALT: 18 U/L (ref 0–55)
AST (SGOT): 19 U/L (ref 5–34)
Albumin/Globulin Ratio: 1.3 (ref 0.9–2.2)
Albumin: 3.8 g/dL (ref 3.5–5.0)
Alkaline Phosphatase: 108 U/L — ABNORMAL HIGH (ref 37–106)
Anion Gap: 10 (ref 5.0–15.0)
BUN: 13 mg/dL (ref 7.0–19.0)
Bilirubin, Total: 0.8 mg/dL (ref 0.2–1.2)
CO2: 24 mEq/L (ref 22–29)
Calcium: 8.9 mg/dL (ref 8.5–10.5)
Chloride: 105 mEq/L (ref 100–111)
Creatinine: 0.8 mg/dL (ref 0.6–1.0)
Globulin: 3 g/dL (ref 2.0–3.6)
Glucose: 96 mg/dL (ref 70–100)
Potassium: 4.4 mEq/L (ref 3.5–5.1)
Protein, Total: 6.8 g/dL (ref 6.0–8.3)
Sodium: 139 mEq/L (ref 136–145)

## 2019-07-21 LAB — HEMOLYSIS INDEX: Hemolysis Index: 4 (ref 0–18)

## 2019-07-21 LAB — GFR: EGFR: >60

## 2019-07-21 MED ORDER — INFLUENZA VAC SPLIT QUAD 0.5 ML IM SUSY
PREFILLED_SYRINGE | Freq: Once | INTRAMUSCULAR | Status: DC
Start: 2019-07-22 — End: 2019-07-22

## 2019-07-21 NOTE — UM Notes (Addendum)
Primary Payor: MEDICAID HMO/[employer]ICA[employer]  UTILIZATION REVIEW CONTACT : Eddie North BSN, RN, ACM  Utilization Review Case Manager II / Case Management  [facility]  [address]  [place], [place] [address]  T [phone]  Judie Petit [phone]   F [phone]    Email: [email or web address]  NPI: [id number] Tax ID:  [id number]      Admission Orders  Admit to Inpatient (Order [id number])  Comments: Remote tele   Diagnosis: Pneumothorax    Level of Care: Acute    Patient Class: Inpatient       References: I[facility] Bed Placement CriteriaIFMC Bed Placement CriteriaIFOH Bed Placement CriteriaILH Bed Placement [facility] Bed Placement Criteria   Question Answer Comment   Admitting Physician Mahlon Gammon, MAJID    Service: Medicine    Estimated Length of Stay > or = to 2 midnights    Tentative Discharge Plan? Home or Self Care    Does patient need telemetry? Yes    Telemetry type (separate Telemetry order is also required): Medical telemetry               PATIENT NAME: [name],[name]  DOB: 06/13/1990  PMH:  has a past medical history of Anal fissure, Gastroesophageal reflux disease (2014), and Hemorrhoid.  PSH:  has no past surgical history on file.    ADMISSION REVIEW   History of present illness: Pt is a 29 y.o. female arrived at [facility] on 07/20/2019 at 1424.and admitted to 23N    Arrival VS: Vitals BP: 111/69, Temp: 98.4 F (36.9 C), Temp Source: Oral, Heart Rate: (!) 52, Resp Rate: 17, SpO2: 97 %, Height: 167.6 cm (5\' 6" ), Weight: 102.5 kg (225 lb 14.4 oz)      BP 104/68    Pulse 79    Temp 98.8 F (37.1 C) (Oral)    Resp 16    Ht 1.676 m (5' 5.98")    Wt 102.4 kg (225 lb 12 oz)    SpO2 94%    BMI 36.45 kg/m     Temp:  [97.9 F (36.6 C)-100.2 F (37.9 C)]   Heart Rate:  [53-86]   Resp Rate:  [16-21]   BP: (103-126)/(66-75)   SpO2:  [94 %-100 %]   Height:  [167.6 cm (5' 5.98")]   Weight:  [102.4 kg (225 lb 12 oz)-102.5 kg (225 lb 14.4 oz)]   BMI  (calculated):  [36.6]     Pain 7-8 /10  Last recorded pain score:  Pain Scale Used: Numeric Scale (0-10)  Pain Score: 5-moderate pain     Abnormal Labs:   Lab Results last 48 Hours     Procedure Component Value Units Date/Time    Comprehensive metabolic panel [[id number]]  (Abnormal) Collected: 07/21/19 0357     Alkaline Phosphatase 108 U/L     CBC and differential [[id number]]  (Abnormal) Collected: 07/21/19 0357     Lymphocytes Absolute Automated 2.11 x10 3/uL      Monocytes Absolute Automated 0.86 x10 3/uL     COVID-19 (SARS-COV-2) ([place] Rapid) [[id number]] Collected: 07/20/19 1448    Specimen: Nasopharyngeal Swab from Nasopharynx Updated: 07/20/19 1822     Purpose of COVID testing Screening     SARS-CoV-2 Specimen Source Nasopharyngeal     SARS CoV 2 Overall Result Negative    Troponin I [[id number]] Collected: 07/20/19 1441    Specimen: Blood Updated: 07/20/19 1514     Troponin I <0.01 ng/mL         Diagnostics:  CT Chest  WO Contrast (Final result)  Result time 07/20/19 19:06:41  Final result by Adine Madura, MD (07/20/19 19:06:41)  Impression:  1. Small residual right pneumothorax.   2. Atelectasis versus infiltrate in the dependent portion of the lungs.     XR Chest AP Portable (Final result)  Result time 07/20/19 16:04:31  Final result by Filbert Schilder, MD (07/20/19 16:04:31)  Impression:  Status post right chest tube placement with resolution of   right-sided pneumothorax.     Chest AP Portable (Final result)  Result time 07/20/19 14:18:44  Final result by Annye Asa, MD (07/20/19 14:18:44)  Impression:  Moderate large right pneumothorax.   These results were given to PA Reck on 07/20/2019 at 2:15 PM.     EKG:  Interpreted by the EP.              Time Interpreted: 242PM              Rate: 62              Interpretation: NSR, normal axis, normal intervals, no ST abnormalities, normal EKG               Comparison: no prior    Medications in ED:    Medication Administration from 07/20/2019 1330 to  07/20/2019 1930  Date/Time Order Dose Route Action Action by Comments  07/20/2019 1521 lidocaine-EPINEPHrine (XYLOCAINE W/EPI) 1 %-1:100000 injection 20 mL 20 mL Intradermal Given by Earnest Bailey, RN given by atmar MD during procedure  07/20/2019 1520 fentaNYL (PF) (SUBLIMAZE) injection 50 mcg 50 mcg Intravenous Given Cletis Athens, RN   07/20/2019 1527 lidocaine (XYLOCAINE) 1 % injection 10 mL 10 mL Intradermal Given by Earnest Bailey, RN given by MD atmar during procedure  07/20/2019 1723 morphine injection 4 mg 4 mg Intravenous Given Cletis Athens, RN         MD Notes:  Pulmonology note 07/20/2019  Assesment:   Primary spontaneous pneumothorax  .  Chest pain  .  Hypoxemia  Plan:   Primary spontaneous pneumothorax, status post chest tube placement, will order noncontrast CT of the chest, will repeat chest x-ray in the morning  .  Pain medications  .  Continue oxygen    H&P note 07/20/2019  ASSESSMENT AND PLAN:  This is a young lady who presents with a spontaneous pneumothorax, most  likely a primary spontaneous pneumothorax.  Would obtain urine rapid drug  test to rule out surreptitious drug use.  Will also obtain a D-dimer to  rule out pulmonary embolism, which are possible causes of pneumothorax.   She does not appear to have the build and the physique to suggest  Ehlers-Danlos syndrome.  A chest tube has been placed with resolution of  the pneumothorax.  Analgesics will be provided for pain relief.  Pulmonary  consultation to assist with management and manage chest tube.  Further  recommendations will be based on hospital course.  Plan as outlined has  been discussed with the patient at the bedside.    Pulmonology note 07/21/2019  Assessment:   Primary spontaneous pneumothorax  .  Chest pain  Plan:   Primary spontaneous pneumothorax, status post chest tube placement, no air leak noted, I will place chest tube to waterseal, repeat chest x-ray in couple of hours and then in the morning, may  remove chest tube tomorrow  .  CT of the chest has been reviewed  .  Pain medications as tolerated    Orders  Labs,  VS, CXR    Medications: Scheduled Meds:  Current Facility-Administered Medications   Medication Dose Route Frequency    [START ON 07/22/2019] influenza   Intramuscular Once     Continuous Infusions:  PRN Meds:.acetaminophen, morphine, ondansetron, oxyCODONE    Medications 02/01 02/02 02/03  02/04 02/05 02/06  02/07 02/08 02/09  02/10   acetaminophen (TYLENOL) tablet 650 mg   Dose: 650 mg  Freq: Every 6 hours PRN Route: PO  PRN Reason: mild pain  Start: 07/20/19 1812            21071         morphine injection 4 mg   Dose: 4 mg  Freq: Every 4 hours PRN Route: IV  PRN Reason: severe pain  Start: 07/20/19 1811 End: 07/22/19 1810    Order specific questions:   Critical Shortage Criteria for Use: Patients in severe pain who cannot tolerate/fail oral medications             02042        08174     17335        ondansetron (ZOFRAN) injection 4 mg   Dose: 4 mg  Freq: Every 4 hours PRN Route: IV  PRN Reasons: Nausea,Vomiting  Start: 07/20/19 1812            23016      03157     16109        oxyCODONE (ROXICODONE) immediate release tablet 5 mg   Dose: 5 mg  Freq: Every 4 hours PRN Route: PO  PRN Reason: moderate pain  Start: 07/20/19 1812            [phone]      [id number]     [id number]     [id number]     [id number]

## 2019-07-21 NOTE — Plan of Care (Signed)
Problem: Moderate/High Fall Risk Score >5  Goal: Patient will remain free of falls  Outcome: Progressing  Flowsheets (Taken 07/21/2019 0830)  High (Greater than 13):   HIGH-Visual cue at entrance to patient's room   HIGH-Bed alarm on at all times while patient in bed   HIGH-Utilize chair pad alarm for patient while in the chair   HIGH-Apply yellow "Fall Risk" arm band   HIGH-Initiate use of floor mats as appropriate   HIGH-Consider use of low bed     Problem: Safety  Goal: Patient will be free from injury during hospitalization  Outcome: Progressing  Flowsheets (Taken 07/21/2019 1836)  Patient will be free from injury during hospitalization:   Assess patient's risk for falls and implement fall prevention plan of care per policy   Provide and maintain safe environment   Hourly rounding   Include patient/ family/ care giver in decisions related to safety  Goal: Patient will be free from infection during hospitalization  Outcome: Progressing  Flowsheets (Taken 07/21/2019 1836)  Free from Infection during hospitalization:   Assess and monitor for signs and symptoms of infection   Monitor lab/diagnostic results   Monitor all insertion sites (i.e. indwelling lines, tubes, urinary catheters, and drains)   Encourage patient and family to use good hand hygiene technique     Problem: Pain  Goal: Pain at adequate level as identified by patient  Outcome: Progressing  Flowsheets (Taken 07/21/2019 1836)  Pain at adequate level as identified by patient:   Identify patient comfort function goal   Assess for risk of opioid induced respiratory depression, including snoring/sleep apnea. Alert healthcare team of risk factors identified.   Assess pain on admission, during daily assessment and/or before any "as needed" intervention(s)   Reassess pain within 30-60 minutes of any procedure/intervention, per Pain Assessment, Intervention, Reassessment (AIR) Cycle   Evaluate if patient comfort function goal is met   Evaluate patient's  satisfaction with pain management progress   Offer non-pharmacological pain management interventions   Include patient/patient care companion in decisions related to pain management as needed     Problem: Side Effects from Pain Analgesia  Goal: Patient will experience minimal side effects of analgesic therapy  Outcome: Progressing  Flowsheets (Taken 07/21/2019 1836)  Patient will experience minimal side effects of analgesic therapy:   Monitor/assess patient's respiratory status (RR depth, effort, breath sounds)   Prevent/manage side effects per LIP orders (i.e. nausea, vomiting, pruritus, constipation, urinary retention, etc.)   Evaluate for opioid-induced sedation with appropriate assessment tool (i.e. POSS)     Problem: Discharge Barriers  Goal: Patient will be discharged home or other facility with appropriate resources  Outcome: Progressing  Flowsheets (Taken 07/21/2019 1836)  Discharge to home or other facility with appropriate resources: Provide appropriate patient education  Note: Patient alert and oriented x4, vitals monitored, afebrile.  Patient with chest tube on R  4th intercostal space.  Dressing intact.  Chest tube attached to continuous suction initially, now on waterseal until tomorrow per Dr. Gwynneth Albright.  Xray was done and will repeat Xray tomorrow.  No output in waterseal chamber, no air leak noted, dressing intact.  Patient reported pain on the site, medicated with prn pain med with moderate relief.  Patient lactating, pumping and dumping breastmilk several times today.  Patient risk of fall, fall bundle in place, patient calls for assistance, will continue with hourly rounding.

## 2019-07-21 NOTE — Progress Notes (Addendum)
[facility]  PROGRESS NOTE      Patient: [name]  Date: 07/21/2019   LOS: 1 Days  Admission Date: 07/20/2019   MRN: [id number]  Attending: Lamona Curl  Please contact me on Epic secure chat        Patient is 29 year old female with no remarkable history presented with sudden onset of shortness of breath and was diagnosed with right-sided spontaneous pneumothorax.    SUBJECTIVE   Patient seen and examined at bedside with RN.    Patient states, expresses pain on the right side of the chest upon deep breathing and mobility.  Chest x-ray showed resolving pneumothorax, chest tube placed on waterseal.  Follow-up chest x-ray in the morning and if remains a stable, chest tube will be removed.  Patient has no other family member with similar issue.  She is eager to be discharged, has 42-month-old baby.  OBJECTIVE     Vitals:    07/21/19 1119   BP: 105/68   Pulse: 78   Resp: 18   Temp: 98.4 F (36.9 C)   SpO2: 94%       Temperature: Temp  Min: 97.9 F (36.6 C)  Max: 100.2 F (37.9 C)  Pulse: Pulse  Min: 53  Max: 86  Respiratory: Resp  Min: 16  Max: 23  Non-Invasive BP: BP  Min: 103/66  Max: 126/75  Pulse Oximetry SpO2  Min: 94 %  Max: 100 %    Intake and Output Summary (Last 24 hours) at Date Time    Intake/Output Summary (Last 24 hours) at 07/21/2019 1503  Last data filed at 07/21/2019 0730  Gross per 24 hour   Intake 1450 ml   Output 5 ml   Net 1445 ml            PHYSICAL EXAMINATION  GEN APPEARANCE: NAD.  Pleasant, alert and cooperative. Comfortable.   HEENT: NCAT, EOMI, PERRL, no nasal discharge.   conjunctivae/corneas clear. Oral mucosa moist.   Neck: Supple without meningismus, no JVD   CVS: RRR, S1, S2. No murmur, click, rub, or gallop   LUNGS: CTAB; No wheezes, crackles, rhonchi or rales  ABD: BS+, soft, NT, ND, no guarding or rigidity  EXT: No edema; Pulses 2+ and intact  SKIN: skin is warm and dry. No rash noted.  MSK: full ROM intact. No joint tenderness or swelling.   NEURO: AAOx3, CN 2-12  intact.  No focal neurological deficits  MENTAL STATUS: appropriate affect and mood        MEDICATIONS     Current Facility-Administered Medications   Medication Dose Route Frequency    [START ON 07/22/2019] influenza   Intramuscular Once            LABS/IMAGING     Recent Labs   Lab 07/21/19  0357 07/20/19  1441   WBC 9.10 5.05   RBC 4.11 4.11   Hgb 12.4 12.5   Hematocrit 38.4 38.2   MCV 93.4 92.9   Platelets 287 277       Recent Labs   Lab 07/21/19  0357 07/20/19  1441   Sodium 139 141   Potassium 4.4 4.2   Chloride 105 108   CO2 24 26   BUN 13.0 10.0   Creatinine 0.8 0.8   Glucose 96 82   Calcium 8.9 8.9       Recent Labs   Lab 07/21/19  0357 07/20/19  1441   ALT 18 19   AST (SGOT) 19 16  Bilirubin, Total 0.8 0.6   Albumin 3.8 4.0   Alkaline Phosphatase 108* 104       Recent Labs   Lab 07/20/19  1441   Troponin I <0.01             Microbiology Results     Procedure Component Value Units Date/Time    COVID-19 (SARS-COV-2) [place] Rapid) [[id number]] Collected: 07/20/19 1448    Specimen: Nasopharyngeal Swab from Nasopharynx Updated: 07/20/19 1822     Purpose of COVID testing Screening     SARS-CoV-2 Specimen Source Nasopharyngeal     SARS CoV 2 Overall Result Negative     Comment: Test performed using the Abbott ID NOW EUA assay.  Please see Fact Sheets for patients and providers located at:  http://www.rice.biz/  This test is for the qualitative detection of SARS-CoV-2  (COVID19) nucleic acid. Viral nucleic acids may persist in vivo,  independent of viability. Detection of viral nucleic acid does  not imply the presence of infectious virus, or that virus  nucleic acid is the cause of clinical symptoms. Negative  results should be treated as presumptive and, if inconsistent  with clinical signs and symptoms or necessary for patient  management, should be tested with an alternative molecular  assay. Negative results do not preclude SARS-CoV-2 infection  and should not be used as the sole basis for  patient  management decisions. Invalid results may be due to inhibiting  substances in the specimen and recollection should occur.         Narrative:      o Collect and clearly label specimen type:  o Upper respiratory specimen: One Nasopharyngeal Dry Swab NO  Transport Media.  o Hand deliver to laboratory ASAP  Indication for testing->Extended care facility admission to  semi private room            ASSESSMENT/PLAN     [name] is a 29 y.o. female admitted with Pneumothorax    Spontaneous pneumothorax  Present on admission  Sudden onset of shortness of breath, confirmed right-sided pneumothorax  No typical blebs noted on imaging  S/p chest tube insertion with improvement/resolution of pneumothorax  Patient denies any trauma, no evidence of rib fractures  Chest tube connected to waterseal,subsequent CXR done showed small Rt apical PTX.  If follow-up chest x-ray remains stable in the morning, chest tube will be removed.  Patient saturating well on room air  Encouraged on frequent use of incentive spirometer  Discussed with pulmonary, Dr. Jeryl Columbia.    Obesity  BMI of 36  Lifestyle modification    Nutrition: Heart healthy    Code: Full code    DVT prophylaxis:  SCDs    Disposition: Possible discharge tomorrow after chest tube removal if follow-up chest x-ray remained stable.       Laurence Aly  3:03 PM 07/21/2019     This chart was generated using hospital voice-recognition software which does not employ spell-checking or grammar-checking features. It was dictated, all or in part, in a busy and often noisy patient care environment. I have taken all usual measures to dictate carefully and to review all aspects this chart. Nonetheless, given the known and well-documented performance characteristics of VR software in such patient care environments, this dictation still may contain unrecognized and wholly unintended errors or omissions

## 2019-07-21 NOTE — Progress Notes (Addendum)
Pt admitted. Chart reviewed.  Pt on RA and no tele  Pt has chest pain and no SOB  Pt c/o pain in right chest and back from chest tube.  Chest tube placed on suction: 80 mmHg  Patient's pain controlled with Oxycodone and Morphine.  Pt had nausea, controlled with Zofran.  Pt had fever of 100.2 F @ 2006. Tylenol was given and fever was shown to have reduced to 98.8 F @ 2203  Dr. was paged at 2020 for orders for connection to suction. Pt connected to suction at approximately 2200 @ 80 mm Hg, had 5 mL of drainage all shift  Pt is still breastfeeding.

## 2019-07-21 NOTE — Progress Notes (Signed)
PULM. CCM PROGRESS NOTE    Date Time: 07/21/19 1:46 PM  Patient Name: [name],[name]      Assessment:     .  Primary spontaneous pneumothorax  .  Chest pain    Plan:     .  Primary spontaneous pneumothorax, status post chest tube placement, no air leak noted, I will place chest tube to waterseal, repeat chest x-ray in couple of hours and then in the morning, may remove chest tube tomorrow  .  CT of the chest has been reviewed  .  Pain medications as tolerated    Discussed with patient in detail, explained clinical condition and treatment plan     Subjective:   Patient is a 29 y.o. female who is awake, alert, and comfortable.    Medications:     Current Facility-Administered Medications   Medication Dose Route Frequency    [START ON 07/22/2019] influenza   Intramuscular Once       Review of Systems:     General ROS: negative for - chills, fever or night sweats  Ophthalmic ROS: negative for - double vision, excessive tearing, itchy eyes or photophobia  ENT ROS: negative for - headaches, nasal congestion, sore throat or vertigo  Respiratory ROS: negative for - cough, hemoptysis, orthopnea, positive for some pleuritic pain around the chest tube site with some shortness of breath, no wheezing  Cardiovascular ROS: negative for - chest pain, edema, orthopnea, rapid heart rate  Gastrointestinal ROS: negative for - abdominal pain, blood in stools, constipation, diarrhea, heartburn or nausea/vomiting  Musculoskeletal ROS: negative for - muscle pain  Neurological ROS: negative for - confusion, dizziness, headaches, speech problems, visual changes or weakness  Dermatological ROS: negative for dry skin, pruritus and rash    Physical Exam:   BP 105/68    Pulse 78    Temp 98.4 F (36.9 C) (Oral)    Resp 18    Ht 1.676 m (5' 5.98")    Wt 102.4 kg (225 lb 12 oz)    SpO2 94%    BMI 36.45 kg/m     Intake and Output Summary (Last 24 hours) at Date Time    Intake/Output Summary (Last 24 hours) at 07/21/2019 1346  Last data  filed at 07/21/2019 0730  Gross per 24 hour   Intake 1450 ml   Output 5 ml   Net 1445 ml       General appearance - alert,  and in no distress  Mental status - alert, oriented to person, place, and time  Eyes - pupils equal and reactive, extraocular eye movements intact  Ears - no external ear lesions seen  Nose - no nasal discharge   Mouth - clear oral mucosa, moist   Neck - supple, no significant adenopathy  Chest - clear to auscultation, no wheezes, rales or rhonchi, symmetric air entry  Heart - normal rate, regular rhythm, normal S1, S2, no  rubs, clicks or gallops  Abdomen - soft, nontender, nondistended, no masses or organomegaly  Neurological - alert, oriented, normal speech, no focal findings or movement disorder noted  Musculoskeletal - no joint swelling or tenderness  Extremities -  no pedal edema, no clubbing or cyanosis  Skin - normal coloration, no rashes    Labs:     Recent Labs   Lab 07/21/19  0357 07/20/19  1441   WBC 9.10 5.05   Hgb 12.4 12.5   Hematocrit 38.4 38.2   Platelets 287 277   MCV 93.4 92.9  Neutrophils 66.5 54.3     Recent Labs   Lab 07/21/19  0357 07/20/19  1441   Sodium 139 141   Potassium 4.4 4.2   Chloride 105 108   CO2 24 26   BUN 13.0 10.0   Creatinine 0.8 0.8   Glucose 96 82   Calcium 8.9 8.9   Protein, Total 6.8 6.9   Albumin 3.8 4.0   AST (SGOT) 19 16   ALT 18 19   Alkaline Phosphatase 108* 104   Bilirubin, Total 0.8 0.6     Glucose:    Recent Labs   Lab 07/21/19  0357 07/20/19  1441   Glucose 96 82           Rads:   Radiological Procedure reviewed.  Radiology Results (24 Hour)     Procedure Component Value Units Date/Time    CT Chest WO Contrast [[id number]] Collected: 07/20/19 1856    Order Status: Completed Updated: 07/20/19 1908    Narrative:      CT CHEST WO CONTRAST    CLINICAL INDICATION:   Shortness of breath    TECHNIQUE:Serial axial images through the chest with coronal and  sagittal reconstruction    Note that CT scanning at this site utilizes multiple dose  reduction  techniques including automatic exposure control, adjustment of the MAA  and/or KVP according to the patient's size and use of iterative  reconstruction technique.    COMPARISON: Chest radiograph 07/20/2019    FINDINGS: Right thoracostomy tube is reidentified. There is a small  residual right-sided pneumothorax. No pleural effusion is seen.  There is increased opacity in the posterior aspect of the lower lobes,  bilaterally.  No mediastinal adenopathy is identified.  The heart measures approximately 11.2 cm in length.  No gross abnormalities in the visualized portion of the abdomen.  Small amount gas is seen in the soft tissues of the right chest wall  adjacent to the right thoracostomy tube.  No acute osseous abnormality is identified.      Impression:          1.  Small residual right pneumothorax.    2.  Atelectasis versus infiltrate in the dependent portion of the lungs.            Tana Felts, MD   07/20/2019 7:06 PM    XR Chest  AP Portable [[id number]] Collected: 07/20/19 1602    Order Status: Completed Updated: 07/20/19 1606    Narrative:      CLINICAL INDICATION: Status post chest tube placement    COMPARISON: 07/20/2019 1404 hours    INTERPRETATION: Single portable frontal view of the chest obtained.  Interval placement of a right-sided chest tube. No residual pneumothorax  identified. Lungs are clear and sulci sharp. Mediastinum midline with  normal cardiomediastinal contour. Diminished lung volumes. Right apex is  scattered by overlying cardiac leads..      Impression:       Status post right chest tube placement with resolution of  right-sided pneumothorax.    Mitali Bapna, MD   07/20/2019 4:04 PM    Chest AP Portable [[id number]] Collected: 07/20/19 1415    Order Status: Completed Updated: 07/20/19 1420    Narrative:      INDICATION: Chest pain.    TECHNIQUE: One view.    FINDINGS: There is a moderate to large right pneumothorax. No  mediastinal shift is noted. The lungs are free of infiltrate. No  pleural  effusions are noted. The heart is not enlarged.  Impression:       Moderate large right pneumothorax.    These results were given to PA Reck on 07/20/2019 at 2:15 PM.    Merri Ray, MD   07/20/2019 2:18 PM            Lattie Haw, MD  Pulmonary and critical care  07/21/19

## 2019-07-21 NOTE — Progress Notes (Signed)
Situation Patient admitted due to Pneumothorax.     Background Patient has past medical history of:  1.  History of anal fissure.  2.  Hemorrhoids.       Assessment Cm completed assessment with patient. CM introduced self, explained role and verified demographics. Pt is independent with IADL's and ADL's. Pt does not use DME. Pt has Medicaid [place].     Recommendation DCP: Home with no CM needs. Aunt will transport patient home.         07/21/19 1708   Patient Type   Within 30 Days of Previous Admission? No   Healthcare Decisions   Interviewed: Patient   Orientation/Decision Making Abilities of Patient Alert and Oriented x3, able to make decisions   Advance Directive Patient does not have advance directive   Healthcare Agent Appointed No   (RETIRED) Healthcare Agent's Name N/A   (RETIRED) Healthcare Agent's Phone Number N/A   Additional Emergency Contacts? Rosetta Orvan Falconer (grandparent) [phone]   Prior to admission   Prior level of function Independent with ADLs   Type of Residence Private residence   Home Layout Multi-level;Able to live on main level with bedroom/bathroom   Have running water, electricity, heat, etc? Yes   Living Arrangements Family members  (Patient resides with grandparents.)   How do you get to your MD appointments? Self   How do you get your groceries? Self   Who fixes your meals? Self   Who does your laundry? Self   Who picks up your prescriptions? Self   Dressing Independent   Grooming Independent   Feeding Independent   Bathing Independent   Toileting Independent   DME Currently at Home Other (Comment)  (None)   Name of Prior Assisted Living Facility N/A   Home Care/Community Services None   Prior SNF admission? (Detail) N/A   Prior Rehab admission? (Detail) N/A   Adult Protective Services (APS) involved? No   Discharge Planning   Support Systems Family members   Patient expects to be discharged to: Home   Anticipated [place] plan discussed with: Same as interviewed   Potential barriers  to discharge: Other  (Patient has no barriers to discharge.)   Mode of transportation: Private car (family member)   Does the patient have perscription coverage? Yes   Consults/Providers   PT Evaluation Needed 2   OT Evalulation Needed 2   SLP Evaluation Needed 2   Correct PCP listed in Epic? Yes   Family and PCP   In case you are admitted, would like family notified? Yes   Name of family member to be notified Rosetta Orvan Falconer (grandparent) [phone]   In case you are admitted, would like your PCP notified? Unknown   PCP on file was verified as the current PCP? Provider not on file   PCP - first name Dr. Rubin Payor   PCP - last name [place]   PCP - state [place]   Important Message from Medicare Notice   Patient received 1st IMM Letter? n/a     Suezanne Jacquet, MSW, Social Worker 1  Case Management 1  [phone]

## 2019-07-21 NOTE — Progress Notes (Signed)
Dr. Jeryl Columbia called and notified RN that he placed patient on waterseal, no air leak noted and patient will remain in waterseal until tomorrow. Xrays were ordered.  Will continue to monitor.

## 2019-07-22 ENCOUNTER — Inpatient Hospital Stay: Payer: Medicaid Other

## 2019-07-22 LAB — COMPREHENSIVE METABOLIC PANEL
ALT: 15 U/L (ref 0–55)
AST (SGOT): 13 U/L (ref 5–34)
Albumin/Globulin Ratio: 1.2 (ref 0.9–2.2)
Albumin: 3.7 g/dL (ref 3.5–5.0)
Alkaline Phosphatase: 100 U/L (ref 37–106)
Anion Gap: 10 (ref 5.0–15.0)
BUN: 11 mg/dL (ref 7.0–19.0)
Bilirubin, Total: 0.8 mg/dL (ref 0.2–1.2)
CO2: 24 mEq/L (ref 22–29)
Calcium: 8.8 mg/dL (ref 8.5–10.5)
Chloride: 106 mEq/L (ref 100–111)
Creatinine: 0.8 mg/dL (ref 0.6–1.0)
Globulin: 3 g/dL (ref 2.0–3.6)
Glucose: 96 mg/dL (ref 70–100)
Potassium: 4.1 mEq/L (ref 3.5–5.1)
Protein, Total: 6.7 g/dL (ref 6.0–8.3)
Sodium: 140 mEq/L (ref 136–145)

## 2019-07-22 LAB — CBC AND DIFFERENTIAL
Absolute NRBC: 0 10*3/uL (ref 0.00–0.00)
Basophils Absolute Automated: 0.02 10*3/uL (ref 0.00–0.08)
Basophils Automated: 0.3 %
Eosinophils Absolute Automated: 0.05 10*3/uL (ref 0.00–0.44)
Eosinophils Automated: 0.8 %
Hematocrit: 37.8 % (ref 34.7–43.7)
Hgb: 12.3 g/dL (ref 11.4–14.8)
Immature Granulocytes Absolute: 0.01 10*3/uL (ref 0.00–0.07)
Immature Granulocytes: 0.2 %
Lymphocytes Absolute Automated: 2.14 10*3/uL (ref 0.42–3.22)
Lymphocytes Automated: 32.2 %
MCH: 30.4 pg (ref 25.1–33.5)
MCHC: 32.5 g/dL (ref 31.5–35.8)
MCV: 93.6 fL (ref 78.0–96.0)
MPV: 9.7 fL (ref 8.9–12.5)
Monocytes Absolute Automated: 0.66 10*3/uL (ref 0.21–0.85)
Monocytes: 9.9 %
Neutrophils Absolute: 3.77 10*3/uL (ref 1.10–6.33)
Neutrophils: 56.6 %
Nucleated RBC: 0 /100 WBC (ref 0.0–0.0)
Platelets: 275 10*3/uL (ref 142–346)
RBC: 4.04 10*6/uL (ref 3.90–5.10)
RDW: 13 % (ref 11–15)
WBC: 6.65 10*3/uL (ref 3.10–9.50)

## 2019-07-22 LAB — HEMOLYSIS INDEX: Hemolysis Index: 3 (ref 0–18)

## 2019-07-22 LAB — MAGNESIUM: Magnesium: 2.1 mg/dL (ref 1.6–2.6)

## 2019-07-22 LAB — PHOSPHORUS: Phosphorus: 3.5 mg/dL (ref 2.3–4.7)

## 2019-07-22 LAB — GFR: EGFR: >60

## 2019-07-22 MED ORDER — OXYCODONE-ACETAMINOPHEN 7.5-325 MG PO TABS
1.0000 | ORAL_TABLET | ORAL | 0 refills | Status: DC | PRN
Start: 2019-07-22 — End: 2019-07-22

## 2019-07-22 MED ORDER — OXYCODONE-ACETAMINOPHEN 5-325 MG PO TABS
1.00 | ORAL_TABLET | ORAL | 0 refills | Status: AC | PRN
Start: 2019-07-22 — End: 2019-07-29

## 2019-07-22 NOTE — Plan of Care (Signed)
Problem: Moderate/High Fall Risk Score >5  Goal: Patient will remain free of falls  Outcome: Progressing  Flowsheets  Taken 07/22/2019 0531 by Maryfrances Bunnell, RN  Moderate Risk (6-13): MOD-Perform dangle, stand, walk (DSW) when getting patient up  VH Moderate Risk (6-13): Use of floor mat  Taken 07/21/2019 0830 by Doylene Canard Rj, RN  High (Greater than 13):   HIGH-Visual cue at entrance to patient's room   HIGH-Bed alarm on at all times while patient in bed   HIGH-Utilize chair pad alarm for patient while in the chair   HIGH-Apply yellow "Fall Risk" arm band   HIGH-Initiate use of floor mats as appropriate   HIGH-Consider use of low bed     Problem: Pain  Goal: Pain at adequate level as identified by patient  Outcome: Progressing  Flowsheets (Taken 07/21/2019 1836 by Levan Hurst, RN)  Pain at adequate level as identified by patient:   Identify patient comfort function goal   Assess for risk of opioid induced respiratory depression, including snoring/sleep apnea. Alert healthcare team of risk factors identified.   Assess pain on admission, during daily assessment and/or before any "as needed" intervention(s)   Reassess pain within 30-60 minutes of any procedure/intervention, per Pain Assessment, Intervention, Reassessment (AIR) Cycle   Evaluate if patient comfort function goal is met   Evaluate patient's satisfaction with pain management progress   Offer non-pharmacological pain management interventions   Include patient/patient care companion in decisions related to pain management as needed

## 2019-07-22 NOTE — Discharge Instr - Activity (Signed)
As Tolerated

## 2019-07-22 NOTE — Progress Notes (Signed)
PULM. CCM PROGRESS NOTE    Date Time: 07/22/19 11:04 AM  Patient Name: [name],[name] LYLA      Assessment:     .  Primary spontaneous pneumothorax  .  Chest pain    Plan:     .  Reviewed chest x-ray from this morning, very small apical pneumothorax, chest tube has been on waterseal since yesterday afternoon.  I remove the chest tube, will repeat chest x-ray and if it remains stable patient can be discharged to home  .  Pain medications as tolerated    Discussed with patient  Discussed with bedside nurse    Subjective:   Patient is a 29 y.o. female who is awake, alert, and comfortable.     Medications:     Current Facility-Administered Medications   Medication Dose Route Frequency    influenza   Intramuscular Once       Review of Systems:     General ROS: negative for - chills, fever or night sweats  Ophthalmic ROS: negative for - double vision, excessive tearing, itchy eyes or photophobia  ENT ROS: negative for - headaches, nasal congestion, sore throat or vertigo  Respiratory ROS: negative for - cough, hemoptysis, orthopnea, pleuritic pain, shortness of breath, tachypnea or wheezing  Cardiovascular ROS: negative for - chest pain, edema, orthopnea, rapid heart rate  Gastrointestinal ROS: negative for - abdominal pain, blood in stools, constipation, diarrhea, heartburn or nausea/vomiting  Musculoskeletal ROS: negative for - muscle pain  Neurological ROS: negative for - confusion, dizziness, headaches, speech problems, visual changes or weakness  Dermatological ROS: negative for dry skin, pruritus and rash    Physical Exam:   BP 115/78    Pulse 69    Temp 98.2 F (36.8 C) (Oral)    Resp 17    Ht 1.676 m (5' 5.98")    Wt 102.4 kg (225 lb 12 oz)    SpO2 93%    BMI 36.45 kg/m     Intake and Output Summary (Last 24 hours) at Date Time    Intake/Output Summary (Last 24 hours) at 07/22/2019 1104  Last data filed at 07/22/2019 0747  Gross per 24 hour   Intake 1370 ml   Output 5 ml   Net 1365 ml       General appearance  - alert,  and in no distress  Mental status - alert, oriented to person, place, and time  Eyes - pupils equal and reactive, extraocular eye movements intact  Ears - no external ear lesions seen  Nose - no nasal discharge   Mouth - clear oral mucosa, moist   Neck - supple, no significant adenopathy  Chest - clear to auscultation, no wheezes, rales or rhonchi, symmetric air entry  Heart - normal rate, regular rhythm, normal S1, S2, no murmurs, rubs, clicks or gallops  Abdomen - soft, nontender, nondistended, no masses or organomegaly  Neurological - alert, oriented, normal speech, no focal findings or movement disorder noted  Musculoskeletal - no joint swelling or tenderness  Extremities -  no pedal edema, no clubbing or cyanosis  Skin - normal coloration, no rashes    Labs:     Recent Labs   Lab 07/22/19  0313 07/21/19  0357 07/20/19  1441   WBC 6.65 9.10 5.05   Hgb 12.3 12.4 12.5   Hematocrit 37.8 38.4 38.2   Platelets 275 287 277   MCV 93.6 93.4 92.9   Neutrophils 56.6 66.5 54.3     Recent Labs  Lab 07/22/19  0313 07/21/19  0357 07/20/19  1441   Sodium 140 139 141   Potassium 4.1 4.4 4.2   Chloride 106 105 108   CO2 24 24 26    BUN 11.0 13.0 10.0   Creatinine 0.8 0.8 0.8   Glucose 96 96 82   Calcium 8.8 8.9 8.9   Magnesium 2.1  --   --    Phosphorus 3.5  --   --    Protein, Total 6.7 6.8 6.9   Albumin 3.7 3.8 4.0   AST (SGOT) 13 19 16    ALT 15 18 19    Alkaline Phosphatase 100 108* 104   Bilirubin, Total 0.8 0.8 0.6     Glucose:    Recent Labs   Lab 07/22/19  0313 07/21/19  0357 07/20/19  1441   Glucose 96 96 82           Rads:   Radiological Procedure reviewed.  Radiology Results (24 Hour)     Procedure Component Value Units Date/Time    XR Chest AP Portable [[id number]] Resulted: 07/22/19 0900    Order Status: Sent Updated: 07/22/19 1007    XR Chest AP Portable [[id number]] Collected: 07/22/19 0821    Order Status: Completed Updated: 07/22/19 0825    Narrative:      EXAMINATION: XR CHEST AP PORTABLE    COMPARISON:  Chest radiographs dated 07/21/2019.    FINDINGS: Portable AP view of the chest was performed.  There is  hypoinflation with associated bronchovascular crowding.  Stable  positioning of right thoracostomy tube. Small residual right  pneumothorax appears grossly unchanged as compared to 07/21/2019,  allowing for differences in technique. Linear retrocardiac opacities  likely represent atelectasis. Otherwise no focal consolidation, pleural  effusion or left pneumothorax.  The cardiomediastinal silhouette and  pulmonary vascular markings are within normal limits.  Osseous  structures and soft tissues are unremarkable.      Impression:          1. Small residual right apical pneumothorax, unchanged from comparison  allowing for differences in technique.  2. Stable positioning of right thoracostomy tube.       Casimiro Needle [facility]   07/22/2019 8:23 AM    XR Chest AP Portable [[id number]] Collected: 07/21/19 1506    Order Status: Completed Updated: 07/21/19 1512    Narrative:      XR CHEST AP PORTABLE  CLINICAL INDICATION: pneumothorax    COMPARISON: None available.    FINDINGS:  A portable AP view  of the chest was obtained. The right  chest tube remains in position overlying the midlung. A small right  apical pneumothorax is noted not clearly evident on the previous study  although this could be due to limitations in technique. The left lung is  clear and well expanded. Patchy left base retrocardiac infiltrate versus  subsegmental atelectasis is noted. The heart and vascularity are within  normal limits.      Impression:       Small residual right apical pneumothorax. Left lower lobe  retrocardiac infiltrate versus subsegmental atelectasis.     Heron Nay, MD   07/21/2019 3:10 PM            Lattie Haw, MD  Pulmonary and critical care  07/22/19

## 2019-07-22 NOTE — Discharge Instructions (Signed)
Chest Tubes  Your lungs are each surrounded by 2 layers of membrane (pleura). The space between these layers is called the pleural space. Normally the pleural space has a tiny amount of fluid in it. But extra fluid, blood, pus, or air in the pleural space makes it hard for the lung to expand and makes breathing hard. A chest tube is a soft, flexible tube put into the pleural space that surrounds the lung. The tube doesn't go into the lung itself. The tube drains blood, air, or extra fluid. The tube is inserted through a small cut (incision) in the skin.     Reasons for a chest tube  You may need a chest tube:  · After chest surgery or injury to the chest  · To treat a lung infection or abscess  · To remove extra fluid from around the lung from other causes. This might be from cancer or congestive heart failure.  · To treat collapsed lung  · To treat bleeding into the chest (hemothorax)  Chest tube placement  A chest tube is often placed during chest surgery while you’re in the operating room and still asleep (sedated). If you have a lung infection or other problem, you may have a chest tube placed while you’re awake in the emergency department or your hospital room. The procedure takes less than 30 minutes. Here’s how it's done:   · Medical staff takes your blood pressure, pulse, and temperature.  · You lie on your side or sit in a semi-upright position. You will be asked to put one arm up over your head.  · The healthcare provider injects pain medicine (anesthetic) to numb the area where the chest tube is placed. You may be given medicine to make you relax (sedation) or pain medicine. Sedation is given in an IV (intravenous) line in your hand or arm.  · The provider makes a small incision is made in your side, chest, or back. He or she puts a soft, flexible tube into the incision site. The tube is guided between your ribs until it reaches the pleural space. The provider may use ultrasound imaging to help place  the tube correctly.  · The provider may stitch) the tube to your skin to keep it in place. It will also be covered with an airtight bandage. The tubing will be taped to your body. This is to keep it from being pulled out by accident.  · The tube is then connected to a drainage device.  ? A chest drainage unit pulls the extra fluid, blood, pus, or air from your chest. The device should be lower than your chest level and may be put on the floor. Some chest tubes contain water and may make a bubbling sound while they are working. Other chest tubes will not make any sound at all.   ? A flutter valve is a small one-way valve. It's used if you have a collapsed lung ( pneumothorax). The lung collapsed because of the collection of air in the pleural space. The valve is attached directly to the end of your chest tube. The valve opens to let air escape from the chest tube. It then closes to prevent air from going back in the tube. You may go home from the hospital with your chest tube attached to a flutter valve. Care for it as you are told .  · You will have an X-ray after the procedure to help confirm that the tube is in the right place, and that the air and fluid have been   drained.  While the tube is in place  · The tube stays in place for as long as your healthcare provider thinks it's needed. You may be in the hospital until after the tube is removed. Sometimes you may be sent home with the chest tube still in place. If you are sent home with the chest tube in place, you will need home healthcare or a caregiver until it is removed.  · You will be given pain medicine by mouth or by IV. You may have a patient-controlled analgesia (PCA) pump attached to the IV line. This lets you give yourself pain medicine, but it's programmed so you can't overdose. You are usually sent home when you can take oral pain medicine and no longer need IV pain medicine.  · You may need extra oxygen. This is given through a mouth mask or a  flexible tube under your nose. You may also be connected to a small device called a pulse oximeter. It measures the amount of oxygen in your blood. It is placed on your finger, toe, or ear.  · After the tube is placed, you can help with drainage by:  ? Breathing deeply  ? Coughing  ? Sitting upright  ? Moving and walking around if told to do so by your healthcare provider  · You can reduce discomfort by holding a pillow tightly to your chest when you cough.  · Your breathing and heart rate will be monitored. The tubing will be checked regularly. If blood is draining from your chest, the tubing will be checked for clots. If a clot appears, the tubing may be squeezed to move the clot out of the tube. If fluid is draining from your chest, it may be tested for signs of infection or other problems. You may need antibiotics to prevent or treat infection.  · Tell a nurse right away if you have trouble breathing or chest, shoulder, or neck pain.    Risks and possible complications of chest tubes  A chest tube can have some risks. But the benefits of having the tube usually outweigh the risks. Risks of a chest tube include:   · Air leak  · Infection  · Bleeding  · Reaction to anesthesia used during placement  · Lung damage, and damage to other surrounding structures  · Death  Caution!  Don't pull on the tube or tip over the drainage container. This can cause serious breathing problems. If you pull on the tube or tip over the container, tell a nurse right away. You may be asked to exhale fully or take deep breaths while the tubing is checked.   Removing the chest tube  When the air, blood, pus, or extra fluid is gone from the pleural space, your healthcare provider will remove the tube. This may be done in your hospital bed. You may get more pain medicine before the tube is removed. As the tube is removed, you may be asked to inhale or exhale deeply and then hold your breath. After the tube is removed, the healthcare provider  may close the incision with stitches. Or the incision may be left to close by itself. The provider will put a bandage over the incision. You may have an X-ray after the tube is removed. This is to make sure your lung is still inflated.   Follow-up care  After the tube is removed:  · Follow up with the doctor within 48 hours. You may have another X-ray. This is to check for fluid or air in your   lung. The incision will be checked to make sure it's healing. The bandage may be replaced with a smaller adhesive bandage. You may change the bandage as often as needed.  · Care for the insertion site(s) as directed. Keep the bandage in place for 48 hours. Keep it dry.  · Until a scab has formed on the incision site, you may shower but not take a bath. When a scab has formed you no longer need an adhesive bandage. After the incision has healed you may have a small scar.  When to call the doctor  While the tube is in place and after it has been removed, call the doctor (or alert your nurse) right away if you have any of the following:   · Fever of 100.4ºF (38ºF) or higher, or as directed by your healthcare provider  · Trouble breathing  · Sharp chest pain that may spread to your shoulder or back  · Bluish color of the skin  · Weakness, dizziness, or fainting  · A feeling of anxiety or restlessness  · Fast pulse  [employer] last reviewed this educational content on 12/08/2017  © 2000-2020 [employer] [address], [place] [address]. All rights reserved. This information is not intended as a substitute for professional medical care. Always follow your healthcare professional's instructions.

## 2019-07-22 NOTE — Progress Notes (Signed)
Patient is medically cleared or discharge home  with no CM needs. Patient reported that she has a PCP in [place] that she can follow-up with after discharge. Aunt will transport patient home.      07/22/19 1043   Discharge Disposition   Patient preference/choice provided? Yes   Physical Discharge Disposition Home   Mode of Transportation Car   Patient/Family/POA notified of transfer plan Yes   Patient agreeable to discharge plan/expected d/c date? Yes   Family/POA agreeable to discharge plan/expected d/c date? Yes   Bedside nurse notified of transport plan? Yes   CM Interventions   Follow up appointment scheduled? No   Reason no follow up scheduled? Patient declined   Referral made for home health RN visit? No, Other (comment)   Multidisciplinary rounds/family meeting before d/c? Yes   Medicare Checklist   Is this a Medicare patient? No   Patient received 1st IMM Letter? n/a     Suezanne Jacquet, MSW, Social Worker 1  Case Management 1  [phone]

## 2019-07-22 NOTE — Progress Notes (Signed)
Patient discharged home. Chest tube was removed earlier today before discharge. CXR done s/p CT removal. Discharge summary, medication, and follow up appointment reviewed with pt and she voiced understanding. Patient is in possession with all personal belongings and IVs removed.

## 2019-07-22 NOTE — Discharge Instr - Diet (Signed)
Regular Diet

## 2019-07-22 NOTE — Discharge Summary (Signed)
[facility]      Patient: [name]  Admission Date: 07/20/2019   DOB: 01/02/1991  Discharge Date:    MRN: [id number]  Discharge Attending:Danyl Deems     Referring Physician: Pcp, None, MD  PCP: Pcp, None, MD       DISCHARGE SUMMARY     Discharge Information   Admission Diagnosis:   Pneumothorax    Discharge Diagnosis:   1.  Spontaneous pneumothorax  2.  S/p chest tube insertion with resolution of pneumothorax, saturating well on room air.  Chest tube removed prior to discharge.  3.  Obesity with BMI of 36.    Admission Condition: Guarded  Discharge Condition: Stable  Consultants:   Dr. Nayyar-pulmonary/critical care  Functional Status: As tolerated  Discharged to: Home  Procedures: Chest tube placement  Surgeries: None    Imaging:     Ct Chest Wo Contrast    Result Date: 07/20/2019  1.  Small residual right pneumothorax. 2.  Atelectasis versus infiltrate in the dependent portion of the lungs. Tana Felts, MD  07/20/2019 7:06 PM    Xr Chest Ap Portable    Result Date: 07/22/2019   Stable right apical pneumothorax following chest tube removal. No acute infiltrate. Kinnie Feil, MD  07/22/2019 1:25 PM    Xr Chest Ap Portable    Result Date: 07/22/2019  1. Small residual right apical pneumothorax, unchanged from comparison allowing for differences in technique. 2. Stable positioning of right thoracostomy tube.  Casimiro Needle [facility]  07/22/2019 8:23 AM    Xr Chest Ap Portable    Result Date: 07/21/2019   Small residual right apical pneumothorax. Left lower lobe retrocardiac infiltrate versus subsegmental atelectasis. Heron Nay, MD  07/21/2019 3:10 PM    Xr Chest  Ap Portable    Result Date: 07/20/2019   Status post right chest tube placement with resolution of right-sided pneumothorax. Mitali Bapna, MD  07/20/2019 4:04 PM    Chest Ap Portable    Result Date: 07/20/2019   Moderate large right pneumothorax. These results were given to PA Reck on 07/20/2019 at 2:15 PM. Merri Ray, MD  07/20/2019 2:18 PM     Echo  Results     None        Discharge Medications:     Medication List      START taking these medications    oxyCODONE-acetaminophen 7.5-325 MG per tablet  Commonly known as: PERCOCET  Take 1 tablet by mouth every 4 (four) hours as needed for Pain        CONTINUE taking these medications    docusate sodium 100 MG capsule  Commonly known as: COLACE     hydrocortisone 25 MG suppository  Commonly known as: ANUSOL-HC  Place 1 suppository (25 mg total) rectally 2 (two) times daily.     ibuprofen 200 MG tablet  Commonly known as: ADVIL        STOP taking these medications    acetaminophen 650 MG suppository  Commonly known as: TYLENOL           Where to Get Your Medications      You can get these medications from any pharmacy    Bring a paper prescription for each of these medications   oxyCODONE-acetaminophen 7.5-325 MG per tablet             Hospital Course   Presentation History /   Ms. Branscome is a 29 year old Caucasian lady who was in her usual state of  health until  the morning of admission when she was brushing her teeth, when  she developed acute onset of chest pain and shortness of breath.  She  initially thought this could be due to reflux symptoms.  She took some  Alka-Seltzer and this then did not result in resolution of her symptoms  and, therefore, she presented to the emergency room for further evaluation.   On presentation to the ER, she was noted to have a primary spontaneous  pneumothorax on the right requiring chest tube placement.    Hospital Course (2 Days):  Patient is 29 year old female with no significant PMHx presented with above-mentioned history of having sudden onset of shortness of breath and was confirmed to have right-sided pneumothorax on chest imaging.  Chest tube was placed and monitored serially with chest x-ray and vitals, upon improvement of pneumothorax on chest films, chest tube was connected only to waterseal and follow-up chest x-rays done.  Chest tube was removed after follow-up  x-ray confirmed resolution of the pneumothorax.  Patient stable from pulmonary standpoint and was cleared.  Pain medications were prescribed.  No definite cause of of the pneumothorax identified, no history of trauma, no evidence of PE, not likely to be catamenial, evidence of blebs.  Diagnosis, lab findings, imaging studies and follow-up cares were all discussed with the patient and she verbalized understanding.    RECOMMENDATIONS:    - Patient was informed of abnormal and incidental imaging findings during hospitalization, and advised to review this information with their  medical provider.             Best Practices   Was the patient admitted with either a CHF Exacerbation or Pneumonia? NO     Progress Note/Physical Exam at Discharge     Subjective: Patient reported feeling well, and is ready for discharge.    Vitals:    07/21/19 2332 07/22/19 0308 07/22/19 0747 07/22/19 1217   BP: 104/69 114/76 115/78 106/71   Pulse: 68 69 69 76   Resp: 17 17 17 17    Temp: 97.9 F (36.6 C) 98.1 F (36.7 C) 98.2 F (36.8 C) 98.8 F (37.1 C)   TempSrc: Oral Oral Oral Oral   SpO2: 95% 94% 93% 94%   Weight:  102.4 kg (225 lb 12 oz)     Height:           General: NAD  HEENT: sclera anicteric, OP: Clear, MMM  Cardiovascular: RRR, no m/r/g  Lungs: CTAB, no w/r/r  Abdomen: soft, +BS, NT/ND, no masses, no g/r  Extremities: Warm and well perfused  Skin: no rashes or lesions noted on exposed surfaces  Neuro: Answers questions appropriately, responds to commands       Diagnostics     Labs/Studies Pending at Discharge:    Last Labs   Recent Labs   Lab 07/22/19  0313 07/21/19  0357 07/20/19  1441   WBC 6.65 9.10 5.05   RBC 4.04 4.11 4.11   Hgb 12.3 12.4 12.5   Hematocrit 37.8 38.4 38.2   MCV 93.6 93.4 92.9   Platelets 275 287 277       Recent Labs   Lab 07/22/19  0313 07/21/19  0357 07/20/19  1441   Sodium 140 139 141   Potassium 4.1 4.4 4.2   Chloride 106 105 108   CO2 24 24 26    BUN 11.0 13.0 10.0   Creatinine 0.8 0.8 0.8   Glucose 96  96 82   Calcium 8.8 8.9 8.9  Magnesium 2.1  --   --        Microbiology Results     Procedure Component Value Units Date/Time    COVID-19 (SARS-COV-2) Verne Carrow Rapid) [[id number]] Collected: 07/20/19 1448    Specimen: Nasopharyngeal Swab from Nasopharynx Updated: 07/20/19 1822     Purpose of COVID testing Screening     SARS-CoV-2 Specimen Source Nasopharyngeal     SARS CoV 2 Overall Result Negative     Comment: Test performed using the Abbott ID NOW EUA assay.  Please see Fact Sheets for patients and providers located at:  http://www.rice.biz/  This test is for the qualitative detection of SARS-CoV-2  (COVID19) nucleic acid. Viral nucleic acids may persist in vivo,  independent of viability. Detection of viral nucleic acid does  not imply the presence of infectious virus, or that virus  nucleic acid is the cause of clinical symptoms. Negative  results should be treated as presumptive and, if inconsistent  with clinical signs and symptoms or necessary for patient  management, should be tested with an alternative molecular  assay. Negative results do not preclude SARS-CoV-2 infection  and should not be used as the sole basis for patient  management decisions. Invalid results may be due to inhibiting  substances in the specimen and recollection should occur.         Narrative:      o Collect and clearly label specimen type:  o Upper respiratory specimen: One Nasopharyngeal Dry Swab NO  Transport Media.  o Hand deliver to laboratory ASAP  Indication for testing->Extended care facility admission to  semi private room           Patient Instructions   Discharge Diet: Heart healthy  Discharge Activity: As tolerated  LABS/TESTING recommended after discharge    Follow Up Appointment:  Follow-up Information     Pcp, None, MD .                  Time spent examining patient, discussing with patient/family regarding hospital course, chart review, reconciling medications and discharge planning: > 35 minutes.  This  patient was examined by me on , the day of discharge.    Jilda Panda Yexalen Deike    1:40 PM 07/22/2019

## 2019-07-22 NOTE — Progress Notes (Signed)
Patient is medically cleared or discharge home  with no CM needs. Patient reported that she has a PCP in [place] that she can follow-up with after discharge. Aunt will transport patient home.     Suezanne Jacquet, MSW, Social Worker 1  Case Management 1  [phone]

## 2019-07-22 NOTE — Progress Notes (Addendum)
Pt A&O x4  Pt has chest tube on Right side. 4th ICS. 20 Fr tube size. Currently attached to water seal. Drainage is scant. Dressing is clean, dry, and intact.  Pt has pain in the upper right chest, right arm, and upper back. Pt's pain is managed with Morphine or Oxycodone.  Pt is breastfeeding. Pt pumped at around 2000. Breast milk was discarded.  Pt needs help getting out of bed. Pt is able to ambulate to the bathroom, with a person holding the water seal chamber.  Pt experienced vaginal bleeding. It is assumed that the patient is experiencing menses. Pt states that she has not had menses since giving birth 11 months ago.  Scant amount of drainage in water seal chamber - 5mL  .

## 2019-07-22 NOTE — Plan of Care (Signed)
Problem: Moderate/High Fall Risk Score >5  Goal: Patient will remain free of falls  Outcome: Completed     Problem: Safety  Goal: Patient will be free from injury during hospitalization  Outcome: Completed  Goal: Patient will be free from infection during hospitalization  Outcome: Completed     Problem: Pain  Goal: Pain at adequate level as identified by patient  Outcome: Completed     Problem: Side Effects from Pain Analgesia  Goal: Patient will experience minimal side effects of analgesic therapy  Outcome: Completed     Problem: Discharge Barriers  Goal: Patient will be discharged home or other facility with appropriate resources  Outcome: Completed     Problem: Psychosocial and Spiritual Needs  Goal: Demonstrates ability to cope with hospitalization/illness  Outcome: Completed   Patient to be discharged home

## 2019-07-22 NOTE — Discharge Instr - AVS First Page (Addendum)
Spontaneous Pneumothorax    Pneumothorax is when air leaks out and gets trapped in the space between the lung and the chest wall (pleural space). It can cause complete or partial collapse of a lung. The trapped air prevents the lung from re-inflating. Spontaneous pneumothorax occurs when a weakened spot on the lung surface ("bleb") ruptures. It may occur in people with asthma or emphysema, or even in those with no pre-existing lung disease.  If your pneumothorax is small, it should get better without treatment and can be managed at home. If the amount of trapped air grows larger, it must be removed with a tube placed into the pleural space (catheter).  Home care   Rest at home. Do not do vigorous activity or exercise. Speak with your doctor to determine when it is safe to start exerting yourself again.   You may use acetaminophen or ibuprofen to control pain, unless another medicine was prescribed. Use only the prescribed amount. Note: If you have chronic liver or kidney disease or have ever had a stomach ulcer or gastrointestinal bleeding, talk with your healthcare provider before using these medicines. Also talk to your provider if you are taking medicine to prevent blood clots.   During the next 3 days, it is important to take 4 slow, deep breaths every 1 to 2 hours while awake. Do this even though your chest may hurt when you breathe. It sends extra oxygen and blood to the lung. This is important to help keep the lung expanded. If an incentive spirometer (breathing exercise device) was given, use it as directed.   If you smoke or use e-cigarettes, quit. Ask your healthcare provider for help.   Avoid secondhand smoke. Don't let anyone smoke in your house or car.  Follow-up care  Follow up with your healthcare provider, or as advised, for a repeat chest X-ray to be sure the pneumothorax is not getting larger.  Note: Any X-rays taken will be reviewed by a specialist. You will be notified of any new findings  that may affect your care.  Call 911  Call 911 if any of these occur.   Trouble breathing   Confusion or difficulty arousing   Fainting or loss of consciousness   Rapid heart rate   Passing out or fainting   New pain in the chest, arm, shoulder, neck, or upper back  When to seek medical advice  Call your healthcare provider right away if any of these occur:   Increased pain with breathing   Weakness or dizziness   Fever   Coughing up sputum  [employer]Well last reviewed this educational content on 11/08/2016   2000-2020 [employer]. [address], [place] [address]. All rights reserved. This information is not intended as a substitute for professional medical care. Always follow your healthcare professional's instructions.

## 2019-07-23 NOTE — UM Notes (Signed)
Requesting final auth to be faxed to [phone]     Discharge Date -  07/22/2019  5:37 PM    NAME: [name]             MR#: [id number]   Female, 29 y.o., 1991-01-07  ER ADMIT DATE AND TIME: 07/20/2019  2:24 PM  IP ADMIT DATE: 07/20/19    Hospital Course  Presentation History /   Ms. Hartson is a 29 year old Caucasian lady who was in her usual state of  health until the morning of admission when she was brushing her teeth, when  she developed acute onset of chest pain and shortness of breath. She  initially thought this could be due to reflux symptoms. She took some  Alka-Seltzer and this then did not result in resolution of her symptoms  and, therefore, she presented to the emergency room for further evaluation.  On presentation to the ER, she was noted to have a primary spontaneous  pneumothorax on the right requiring chest tube placement.    Hospital Course (2 Days):  Patient is 29 year old female with no significant PMHx presented with above-mentioned history of having sudden onset of shortness of breath and was confirmed to have right-sided pneumothorax on chest imaging.  Chest tube was placed and monitored serially with chest x-ray and vitals, upon improvement of pneumothorax on chest films, chest tube was connected only to waterseal and follow-up chest x-rays done.  Chest tube was removed after follow-up x-ray confirmed resolution of the pneumothorax.  Patient stable from pulmonary standpoint and was cleared.  Pain medications were prescribed.  No definite cause of of the pneumothorax identified, no history of trauma, no evidence of PE, not likely to be catamenial, evidence of blebs.  Diagnosis, lab findings, imaging studies and follow-up cares were all discussed with the patient and she verbalized understanding.    Patient Address:  [address]  [place] [address]    Patient phone: [phone] (home)     PATIENT NAME: [name],[name]  DOB: 1990/07/16   PMH:  has a past medical  history of Anal fissure, Gastroesophageal reflux disease (2014), and Hemorrhoid.  PSH:  has no past surgical history on file.      DIAGNOSIS:     ICD-10-CM    1. Pneumothorax  J93.9

## 2019-07-24 LAB — ECG 12-LEAD
Atrial Rate: 62 {beats}/min
P Axis: -9 degrees
P-R Interval: 116 ms
Q-T Interval: 412 ms
QRS Duration: 88 ms
QTC Calculation (Bezet): 418 ms
R Axis: 84 degrees
T Axis: 57 degrees
Ventricular Rate: 62 {beats}/min

## 2019-08-02 ENCOUNTER — Emergency Department
Admission: EM | Admit: 2019-08-02 | Discharge: 2019-08-02 | Disposition: A | Discharge status: Other Acute Inpatient Hospital | Payer: Medicaid Other | Attending: Emergency Medicine | Admitting: Emergency Medicine

## 2019-08-02 ENCOUNTER — Emergency Department: Payer: Medicaid Other

## 2019-08-02 DIAGNOSIS — J939 Pneumothorax, unspecified: Secondary | ICD-10-CM | POA: Insufficient documentation

## 2019-08-02 DIAGNOSIS — R079 Chest pain, unspecified: Secondary | ICD-10-CM

## 2019-08-02 DIAGNOSIS — Z87891 Personal history of nicotine dependence: Secondary | ICD-10-CM | POA: Insufficient documentation

## 2019-08-02 DIAGNOSIS — Z79899 Other long term (current) drug therapy: Secondary | ICD-10-CM | POA: Insufficient documentation

## 2019-08-02 LAB — CBC AND DIFFERENTIAL
Absolute NRBC: 0 10*3/uL (ref 0.00–0.00)
Basophils Absolute Automated: 0.04 10*3/uL (ref 0.00–0.08)
Basophils Automated: 0.6 %
Eosinophils Absolute Automated: 0.11 10*3/uL (ref 0.00–0.44)
Eosinophils Automated: 1.7 %
Hematocrit: 38.8 % (ref 34.7–43.7)
Hgb: 13 g/dL (ref 11.4–14.8)
Immature Granulocytes Absolute: 0.01 10*3/uL (ref 0.00–0.07)
Immature Granulocytes: 0.2 %
Lymphocytes Absolute Automated: 2.35 10*3/uL (ref 0.42–3.22)
Lymphocytes Automated: 36.7 %
MCH: 30.7 pg (ref 25.1–33.5)
MCHC: 33.5 g/dL (ref 31.5–35.8)
MCV: 91.5 fL (ref 78.0–96.0)
MPV: 10 fL (ref 8.9–12.5)
Monocytes Absolute Automated: 0.48 10*3/uL (ref 0.21–0.85)
Monocytes: 7.5 %
Neutrophils Absolute: 3.42 10*3/uL (ref 1.10–6.33)
Neutrophils: 53.3 %
Nucleated RBC: 0 /100 WBC (ref 0.0–0.0)
Platelets: 305 10*3/uL (ref 142–346)
RBC: 4.24 10*6/uL (ref 3.90–5.10)
RDW: 12 % (ref 11–15)
WBC: 6.41 10*3/uL (ref 3.10–9.50)

## 2019-08-02 LAB — URINALYSIS REFLEX TO MICROSCOPIC EXAM - REFLEX TO CULTURE
Bilirubin, UA: NEGATIVE
Blood, UA: NEGATIVE
Glucose, UA: NEGATIVE
Ketones UA: NEGATIVE
Nitrite, UA: NEGATIVE
Protein, UR: 30 — AB
Specific Gravity UA: 1.025 (ref 1.001–1.035)
Urine pH: 7 (ref 5.0–8.0)
Urobilinogen, UA: NEGATIVE mg/dL (ref 0.2–2.0)

## 2019-08-02 LAB — COMPREHENSIVE METABOLIC PANEL
ALT: 21 U/L (ref 0–55)
AST (SGOT): 16 U/L (ref 5–34)
Albumin/Globulin Ratio: 1.3 (ref 0.9–2.2)
Albumin: 4.3 g/dL (ref 3.5–5.0)
Alkaline Phosphatase: 100 U/L (ref 37–106)
Anion Gap: 12 (ref 5.0–15.0)
BUN: 14 mg/dL (ref 7–19)
Bilirubin, Total: 0.2 mg/dL (ref 0.2–1.2)
CO2: 23 mEq/L (ref 22–29)
Calcium: 9.4 mg/dL (ref 8.5–10.5)
Chloride: 107 mEq/L (ref 100–111)
Creatinine: 1.2 mg/dL — ABNORMAL HIGH (ref 0.6–1.0)
Globulin: 3.2 g/dL (ref 2.0–3.6)
Glucose: 101 mg/dL — ABNORMAL HIGH (ref 70–100)
Potassium: 4.4 mEq/L (ref 3.5–5.1)
Protein, Total: 7.5 g/dL (ref 6.0–8.3)
Sodium: 142 mEq/L (ref 136–145)

## 2019-08-02 LAB — URINE BHCG POC: Urine bHCG POC: NEGATIVE

## 2019-08-02 LAB — TROPONIN I: Troponin I: <0.01 ng/mL (ref 0.00–0.05)

## 2019-08-02 LAB — GFR: EGFR: 53.5

## 2019-08-02 NOTE — ED Notes (Signed)
PT departed via PTS to [facility] ED.

## 2019-08-03 ENCOUNTER — Observation Stay: Payer: Medicaid Other

## 2019-08-03 ENCOUNTER — Emergency Department: Payer: Medicaid Other

## 2019-08-03 ENCOUNTER — Inpatient Hospital Stay
Admission: EM | Admit: 2019-08-03 | Discharge: 2019-08-10 | DRG: 143 | Disposition: A | Discharge status: Home / Self Care | Payer: Medicaid Other | Attending: Family Medicine | Admitting: Family Medicine

## 2019-08-03 DIAGNOSIS — K219 Gastro-esophageal reflux disease without esophagitis: Secondary | ICD-10-CM | POA: Diagnosis present

## 2019-08-03 DIAGNOSIS — Z6836 Body mass index (BMI) 36.0-36.9, adult: Secondary | ICD-10-CM

## 2019-08-03 DIAGNOSIS — E869 Volume depletion, unspecified: Secondary | ICD-10-CM | POA: Diagnosis present

## 2019-08-03 DIAGNOSIS — R739 Hyperglycemia, unspecified: Secondary | ICD-10-CM | POA: Diagnosis present

## 2019-08-03 DIAGNOSIS — E162 Hypoglycemia, unspecified: Secondary | ICD-10-CM | POA: Diagnosis present

## 2019-08-03 DIAGNOSIS — R079 Chest pain, unspecified: Secondary | ICD-10-CM | POA: Diagnosis present

## 2019-08-03 DIAGNOSIS — Z20822 Contact with and (suspected) exposure to covid-19: Secondary | ICD-10-CM | POA: Diagnosis present

## 2019-08-03 DIAGNOSIS — N179 Acute kidney failure, unspecified: Secondary | ICD-10-CM | POA: Diagnosis present

## 2019-08-03 DIAGNOSIS — R8290 Unspecified abnormal findings in urine: Secondary | ICD-10-CM | POA: Diagnosis present

## 2019-08-03 DIAGNOSIS — J9383 Other pneumothorax: Principal | ICD-10-CM | POA: Diagnosis present

## 2019-08-03 DIAGNOSIS — Z833 Family history of diabetes mellitus: Secondary | ICD-10-CM

## 2019-08-03 DIAGNOSIS — Z87891 Personal history of nicotine dependence: Secondary | ICD-10-CM

## 2019-08-03 DIAGNOSIS — Z8249 Family history of ischemic heart disease and other diseases of the circulatory system: Secondary | ICD-10-CM

## 2019-08-03 DIAGNOSIS — E669 Obesity, unspecified: Secondary | ICD-10-CM | POA: Diagnosis present

## 2019-08-03 DIAGNOSIS — J9811 Atelectasis: Secondary | ICD-10-CM | POA: Diagnosis present

## 2019-08-03 LAB — BASIC METABOLIC PANEL
Anion Gap: 11 (ref 5.0–15.0)
BUN: 16 mg/dL (ref 7.0–19.0)
CO2: 20 mEq/L — ABNORMAL LOW (ref 22–29)
Calcium: 9.1 mg/dL (ref 8.5–10.5)
Chloride: 109 mEq/L (ref 100–111)
Creatinine: 0.9 mg/dL (ref 0.6–1.0)
Glucose: 113 mg/dL — ABNORMAL HIGH (ref 70–100)
Potassium: 4.7 mEq/L (ref 3.5–5.1)
Sodium: 140 mEq/L (ref 136–145)

## 2019-08-03 LAB — CBC AND DIFFERENTIAL
Absolute NRBC: 0 10*3/uL (ref 0.00–0.00)
Basophils Absolute Automated: 0.04 10*3/uL (ref 0.00–0.08)
Basophils Automated: 0.5 %
Eosinophils Absolute Automated: 0.06 10*3/uL (ref 0.00–0.44)
Eosinophils Automated: 0.8 %
Hematocrit: 38.6 % (ref 34.7–43.7)
Hgb: 12.5 g/dL (ref 11.4–14.8)
Immature Granulocytes Absolute: 0.02 10*3/uL (ref 0.00–0.07)
Immature Granulocytes: 0.3 %
Lymphocytes Absolute Automated: 1.55 10*3/uL (ref 0.42–3.22)
Lymphocytes Automated: 19.9 %
MCH: 29.9 pg (ref 25.1–33.5)
MCHC: 32.4 g/dL (ref 31.5–35.8)
MCV: 92.3 fL (ref 78.0–96.0)
MPV: 9.3 fL (ref 8.9–12.5)
Monocytes Absolute Automated: 0.42 10*3/uL (ref 0.21–0.85)
Monocytes: 5.4 %
Neutrophils Absolute: 5.68 10*3/uL (ref 1.10–6.33)
Neutrophils: 73.1 %
Nucleated RBC: 0 /100 WBC (ref 0.0–0.0)
Platelets: 334 10*3/uL (ref 142–346)
RBC: 4.18 10*6/uL (ref 3.90–5.10)
RDW: 12 % (ref 11–15)
WBC: 7.77 10*3/uL (ref 3.10–9.50)

## 2019-08-03 LAB — ECG 12-LEAD
Atrial Rate: 82 {beats}/min
P Axis: 38 degrees
P-R Interval: 116 ms
Q-T Interval: 374 ms
QRS Duration: 90 ms
QTC Calculation (Bezet): 436 ms
R Axis: 76 degrees
T Axis: 58 degrees
Ventricular Rate: 82 {beats}/min

## 2019-08-03 LAB — RAPID DRUG SCREEN, URINE
Barbiturate Screen, UR: NEGATIVE
Benzodiazepine Screen, UR: POSITIVE — AB
Cannabinoid Screen, UR: NEGATIVE
Cocaine, UR: NEGATIVE
Opiate Screen, UR: POSITIVE — AB
PCP Screen, UR: NEGATIVE
Urine Amphetamine Screen: NEGATIVE

## 2019-08-03 LAB — COVID-19 (SARS-COV-2): SARS CoV 2 Overall Result: NEGATIVE

## 2019-08-03 LAB — GFR: EGFR: >60

## 2019-08-03 LAB — HEMOLYSIS INDEX: Hemolysis Index: 8 (ref 0–18)

## 2019-08-03 LAB — PT/INR
PT INR: 0.9 (ref 0.9–1.1)
PT: 12.4 s — ABNORMAL LOW (ref 12.6–15.0)

## 2019-08-03 LAB — HIV-1/2 AG/AB 4TH GEN. W/ REFLEX: HIV Ag/Ab, 4th Generation: NONREACTIVE

## 2019-08-03 MED ORDER — ONDANSETRON HCL 4 MG/2ML IJ SOLN
4.00 mg | Freq: Four times a day (QID) | INTRAMUSCULAR | Status: DC | PRN
Start: 2019-08-03 — End: 2019-08-10

## 2019-08-03 MED ORDER — KETOROLAC TROMETHAMINE 15 MG/ML IJ SOLN
15.00 mg | Freq: Once | INTRAMUSCULAR | Status: AC
Start: 2019-08-03 — End: 2019-08-03
  Administered 2019-08-03: 01:00:00 15 mg via INTRAVENOUS
  Filled 2019-08-03: qty 1

## 2019-08-03 MED ORDER — MORPHINE SULFATE 4 MG/ML IJ/IV SOLN (WRAP)
4.0000 mg | Status: AC | PRN
Start: 2019-08-03 — End: 2019-08-05
  Administered 2019-08-03 (×2): 4 mg via INTRAVENOUS
  Filled 2019-08-03 (×2): qty 1

## 2019-08-03 MED ORDER — KETOROLAC TROMETHAMINE 30 MG/ML IJ SOLN
30.00 mg | Freq: Four times a day (QID) | INTRAMUSCULAR | Status: DC | PRN
Start: 2019-08-03 — End: 2019-08-04

## 2019-08-03 MED ORDER — MORPHINE SULFATE 4 MG/ML IJ/IV SOLN (WRAP)
4.0000 mg | Freq: Once | Status: AC | PRN
Start: 2019-08-03 — End: 2019-08-03
  Administered 2019-08-03: 4 mg via INTRAVENOUS
  Filled 2019-08-03: qty 1

## 2019-08-03 MED ORDER — ACETAMINOPHEN 325 MG PO TABS
650.0000 mg | ORAL_TABLET | Freq: Four times a day (QID) | ORAL | Status: DC | PRN
Start: 2019-08-03 — End: 2019-08-10
  Administered 2019-08-03 – 2019-08-09 (×18): 650 mg via ORAL
  Filled 2019-08-03 (×18): qty 2

## 2019-08-03 MED ORDER — NALOXONE HCL 0.4 MG/ML IJ SOLN (WRAP)
0.20 mg | INTRAMUSCULAR | Status: DC | PRN
Start: 2019-08-03 — End: 2019-08-10

## 2019-08-03 MED ORDER — ONDANSETRON 4 MG PO TBDP
4.00 mg | ORAL_TABLET | Freq: Four times a day (QID) | ORAL | Status: DC | PRN
Start: 2019-08-03 — End: 2019-08-10

## 2019-08-03 MED ORDER — ACETAMINOPHEN 650 MG RE SUPP
650.00 mg | Freq: Four times a day (QID) | RECTAL | Status: DC | PRN
Start: 2019-08-03 — End: 2019-08-10

## 2019-08-03 MED ORDER — MIDAZOLAM HCL 1 MG/ML IJ SOLN (WRAP)
5.00 mg | Freq: Once | INTRAMUSCULAR | Status: AC | PRN
Start: 2019-08-03 — End: 2019-08-03
  Administered 2019-08-03: 01:00:00 5 mg via INTRAVENOUS
  Filled 2019-08-03: qty 5

## 2019-08-03 MED ORDER — MORPHINE SULFATE 2 MG/ML IJ/IV SOLN (WRAP)
1.0000 mg | Status: AC | PRN
Start: 2019-08-03 — End: 2019-08-03
  Administered 2019-08-03 (×2): 1 mg via INTRAVENOUS
  Filled 2019-08-03 (×2): qty 1

## 2019-08-03 MED ORDER — MIDAZOLAM HCL 1 MG/ML IJ SOLN (WRAP)
5.00 mg | Freq: Once | INTRAMUSCULAR | Status: AC
Start: 2019-08-03 — End: 2019-08-03
  Administered 2019-08-03: 01:00:00 2.5 mg via INTRAVENOUS

## 2019-08-03 MED ORDER — BUPIVACAINE HCL (PF) 0.5 % IJ SOLN
10.00 mL | Freq: Once | INTRAMUSCULAR | Status: AC
Start: 2019-08-03 — End: 2019-08-03
  Administered 2019-08-03: 01:00:00 10 mL
  Filled 2019-08-03: qty 30

## 2019-08-03 MED ORDER — MIDAZOLAM HCL 1 MG/ML IJ SOLN (WRAP)
5.00 mg | Freq: Once | INTRAMUSCULAR | Status: DC | PRN
Start: 2019-08-03 — End: 2019-08-10
  Filled 2019-08-03: qty 5

## 2019-08-03 MED ORDER — SODIUM CHLORIDE 0.9 % IV SOLN
INTRAVENOUS | Status: DC
Start: 2019-08-03 — End: 2019-08-04

## 2019-08-03 MED ORDER — OXYCODONE HCL 5 MG PO TABS
5.0000 mg | ORAL_TABLET | ORAL | Status: DC | PRN
Start: 2019-08-03 — End: 2019-08-10
  Administered 2019-08-03 – 2019-08-09 (×21): 5 mg via ORAL
  Filled 2019-08-03 (×21): qty 1

## 2019-08-03 MED ORDER — CEFTRIAXONE SODIUM 1 G IJ SOLR
1.00 g | INTRAMUSCULAR | Status: DC
Start: 2019-08-03 — End: 2019-08-07
  Administered 2019-08-03 – 2019-08-06 (×4): 1 g via INTRAVENOUS
  Filled 2019-08-03 (×4): qty 1000

## 2019-08-03 MED ORDER — MORPHINE SULFATE 2 MG/ML IJ/IV SOLN (WRAP)
2.0000 mg | Freq: Once | Status: AC
Start: 2019-08-03 — End: 2019-08-03
  Administered 2019-08-03: 01:00:00 2 mg via INTRAVENOUS
  Filled 2019-08-03: qty 1

## 2019-08-03 NOTE — Progress Notes (Signed)
Addendum to my earlier note  Patient had complained of chest pressure  X-ray from this afternoon revealed worsening of the pneuthorax  Patient's chest tube changed from waterseal to low intermittent suction  X-ray reveals improvement in pneumothorax  Patient reassured  Discussed with bedside  RN and patient

## 2019-08-03 NOTE — Progress Notes (Signed)
[facility]  PROGRESS NOTE      Patient: [name]  Date: 08/03/2019   LOS: 0 Days  Admission Date: 08/03/2019   MRN: [id number]  Attending: Gwyndolyn Kaufman  Please contact me on the following Spectralink [phone]       ASSESSMENT/PLAN     [name] is a 29 y.o. 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)  she is status post chest tube placement  Analgesics for pain   a noncontrast CT scan of the chest was done on previous admission which was negative for structural process  A D-dimer was normal at that time as well   Alpha 1 antitrypsin level was also normal  Urine rapid drug screen negative    Acute kidney injury  Most likely due to volume depletion  Resolved with IV fluid hydration    Obesity  Diet exercise weight loss and lifestyle modification    Hypoglycemia  Hemoglobin A1c    Rule out UTI  Check urine culture  Empiric antibiotics in the interim antibiotic in the interim    We will discuss further management with pulmonary medicine    Discussed with patient and bedside RN    Analgesia: Morphine, Toradol, oxycodone and Tylenol    Nutrition: Regular diet    DVT Prophylaxis: SCDs       Code Status: Full code    DISPO: To be determined    Family Contact: Patient's grandmother       SUBJECTIVE     [name] states she has no dyspnea but has chest pain chest tube site    MEDICATIONS     Current Facility-Administered Medications   Medication Dose Route Frequency    cefTRIAXone  1 g Intravenous Q24H       PHYSICAL EXAM     Vitals:    08/03/19 1300   BP:    Pulse: (!) 56   Resp: 17   Temp:    SpO2: 100%       Temperature: Temp  Min: 97.6 F (36.4 C)  Max: 98.9 F (37.2 C)  Pulse: Pulse  Min: 56  Max: 98  Respiratory: Resp  Min: 12  Max: 22  Non-Invasive BP: BP  Min: 107/63  Max: 128/91  Pulse Oximetry SpO2  Min: 98 %  Max: 100 %    Intake and Output Summary (Last  24 hours) at Date Time  No intake or output data in the 24 hours ending 08/03/19 1408    GEN APPEARANCE: Obese female in no acute distress A&OX3  HEENT: PERLA; EOMI; Conjunctiva Clear  NECK: Supple; No bruits  CVS: RRR, S1, S2; No M/G/R  LUNGS: CTAB; No Wheezes; No Rhonchi: No rales  ABD: Soft; No TTP; + Normoactive BS  EXT: No edema; Pulses 2+ and intact  Skin exam: Warm and dry  NEURO: CN 2-12 intact; No Focal neurological deficits  CAP REFILL: Less than 3 seconds capillary refill  MENTAL STATUS: Awake alert and oriented x3    Exam done by Gwyndolyn Kaufman, MD on 08/03/19 at 2:08 PM      LABS     Recent Labs   Lab 08/03/19  0427 08/02/19  2142   WBC 7.77 6.41   RBC 4.18 4.24   Hgb 12.5 13.0   Hematocrit 38.6 38.8   MCV 92.3 91.5   Platelets 334 305  Recent Labs   Lab 08/03/19  0427 08/02/19  2142   Sodium 140 142   Potassium 4.7 4.4   Chloride 109 107   CO2 20* 23   BUN 16.0 14   Creatinine 0.9 1.2*   Glucose 113* 101*   Calcium 9.1 9.4       Recent Labs   Lab 08/02/19  2142   ALT 21   AST (SGOT) 16   Bilirubin, Total 0.2   Albumin 4.3   Alkaline Phosphatase 100       Recent Labs   Lab 08/02/19  2142   Troponin I <0.01       Recent Labs   Lab 08/03/19  0427   PT INR 0.9   PT 12.4*       Microbiology Results     Procedure Component Value Units Date/Time    Adult Urine culture [[id number]] Collected: 08/03/19 1341    Specimen: Urine, Clean Catch Updated: 08/03/19 1341    Narrative:      R/O UTI  ABNORMAL U/A  Replace urinary catheter prior to obtaining the urine culture  if it has been in place for greater than or equal to 14  days:->N/A No Foley  Indications for Urine Culture:->Other (please specify in  Comments)    COVID-19 (SARS-COV-2) [facility] Rapid) [[id number]] Collected: 08/03/19 0238    Specimen: Nasopharyngeal Swab from Nasopharynx Updated: 08/03/19 0314     Purpose of COVID testing Screening     SARS-CoV-2 Specimen Source Nasopharyngeal     SARS CoV 2 Overall Result Negative     Comment: Test performed  using the Abbott ID NOW EUA assay.  Please see Fact Sheets for patients and providers located at:  http://www.rice.biz/  This test is for the qualitative detection of SARS-CoV-2  (COVID19) nucleic acid. Viral nucleic acids may persist in vivo,  independent of viability. Detection of viral nucleic acid does  not imply the presence of infectious virus, or that virus  nucleic acid is the cause of clinical symptoms. Negative  results should be treated as presumptive and, if inconsistent  with clinical signs and symptoms or necessary for patient  management, should be tested with an alternative molecular  assay. Negative results do not preclude SARS-CoV-2 infection  and should not be used as the sole basis for patient  management decisions. Invalid results may be due to inhibiting  substances in the specimen and recollection should occur.         Narrative:      o Collect and clearly label specimen type:  o Upper respiratory specimen: One Nasopharyngeal Dry Swab NO  Transport Media.  o Hand deliver to laboratory ASAP  Indication for testing->Extended care facility admission to  semi private room  Rescheduled by [id number] at 08/03/2019 02:35 Reason: Printed by   mistake/Printing Issues.           RADIOLOGY     Upon my review:    [name]  2:08 PM 08/03/2019

## 2019-08-03 NOTE — ED Notes (Signed)
[facility] EMERGENCY DEPARTMENT  ED NURSING NOTE FOR THE RECEIVING INPATIENT NURSE   ED NURSE Jshawn Hurta RN   [facility] [phone]   ED CHARGE RN [phone]   ADMISSION INFORMATION   [name] is a 29 y.o. female admitted with a diagnosis of:    1. Recurrent spontaneous pneumothorax         Isolation: None   Allergies: Patient has no known allergies.   Holding Orders confirmed? Yes   Belongings Documented? Yes   Home medications sent to pharmacy confirmed? No   NURSING CARE   Patient Comes From:   Mental Status: Home Independent  alert and oriented   ADL: Independent with all ADLs   Ambulation: Has not ambulated in ED   Pertinent Information  and Safety Concerns: None     VITAL SIGNS   Time BP Temp Pulse Resp SpO2   0145 108/66 98.9 76 17 100   CT / NIH   CT Head ordered on this patient?  No   NIH/Dysphagia assessment done prior to admission? No   PERSONAL PROTECTIVE EQUIPMENT   Face Shield and N95   LAB RESULTS   Labs Reviewed   COVID-19 (SARS-COV-2)    Narrative:     o Collect and clearly label specimen type:  o Upper respiratory specimen: One Nasopharyngeal Dry Swab NO  Transport Media.  o Hand deliver to laboratory ASAP  Indication for testing->Extended care facility admission to  semi private room  Rescheduled by [id number] at 08/03/2019 02:35 Reason: Printed by   mistake/Printing Issues.  Rescheduled by [id number] at 08/03/2019 02:35 Reason: Printed by   mistake/Printing Issues.   BASIC METABOLIC PANEL   CBC AND DIFFERENTIAL   PT/INR   HEMOLYSIS INDEX   GFR          Ticket to Ride Printed: Yes

## 2019-08-03 NOTE — ED Provider Notes (Signed)
EMERGENCY DEPARTMENT HISTORY AND PHYSICAL EXAM    PPE: Mask, face shield and gloves were worn every time I entered the room.     History of Presenting Illness:  History Provided By: Patient    [name] is a 28 y.o. female pw SOB this morning.  Started suddenly.  No chest pain.  Recent spontaneous PTX s/p 64F chest tube.      No cough or fever.     Reviewed and confirmed past medical, surgical, family and social history as documented.    PCP: Pcp, None, MD  SPECIALISTS:    Review of Systems:  Review of Systems   Constitutional: Negative for chills and fever.   Respiratory: Positive for shortness of breath. Negative for cough.    Cardiovascular: Negative for chest pain and leg swelling.   All other systems reviewed as negative.    Physical Exam:  Vitals:    08/03/19 0130 08/03/19 0135 08/03/19 0140 08/03/19 0145   BP: 110/72 109/70 115/70 108/66   Pulse: 81 76 75 76   Resp: 19 18 18 17    Temp:       TempSrc:       SpO2: 100% 100% 100% 100%   Weight:       Height:         Pulse Oximetry Interpretation: Normal     Physical Exam   Constitutional: Patient is alert.  Well nourished.  NAD  Head: Atraumatic.   Eyes: EOMI. PERRL  ENT:  MMM.   Neck:  FROM. No spinal tenderness. Neck supple.    Cardiovascular: Normal rate and regular rhythm.   Pulmonary/Chest: Effort normal and breath sounds diminished right base. No respiratory distress.   Abdominal: Soft. There is no tenderness. Bowel sounds present and normal.    Musculoskeletal:  No lower extremity edema or tenderness.    Neurological: Patient is alert and oriented to person, place, and time.  No focal deficits.   Skin: Skin is warm and dry.      Cardiac Monitor:  Rate: 90s  Rhythm:  Normal Sinus Rhythm     Old Medical Records: Old medical records.  Nursing notes.  Previous radiology studies.    Patient Update Notes:  ED Course as of Aug 02 224   Tue Aug 03, 2019   0005 D/w Dr. Jeryl Columbia, pigtail is fine.     [BK]      ED Course User Index  [BK] Tenny Craw, MD     ----------------------  PROCEDURE: CHEST TUBE PLACEMENT ---------------------     Performed by the emergency provider    Time: 1:10 AM  Consent:  Discussion of the risks, benefits, and alternatives to the procedure, along with informed consent was not precluded by the urgency of the procedure and the patient condition.  Timeout: A timeout to verify the correct patient, procedure, and site was performed.   Indication:  pneumothorax  Preparation: The patient was placed in the supine position and the skin over the right midclavicular chest wall was prepped with betadine solution.  Technique: Approximately 5 mL of 0.5% marcaine was injected at the level of the nipple along the mid axillary line.  Once adequate analgesia was obtained, a scalpel was used to make a 0.5 cm incision.  Thoravent was then inserted.  The device was attached to a Pleurovac at -20cmH2O suction.   The Pleurovac appeared to be functioning properly  Confirmation of tube placement: A portable CXR did confirm successful tube placement.  Post-procedure: Neurovascular status remains intact.  Patient tolerated the procedure well with no immediate complications.      Provider Notes: PTX, spontaneous and recurrent.  Thoravent placed w/diff.  CXR confirmed re-expansion.     Clinical Impression:   1. Recurrent spontaneous pneumothorax        ED Disposition     ED Disposition Condition Date/Time Comment    Observation  Tue Aug 03, 2019  2:12 AM Admitting Physician: Marya Amsler [[id number]]   Service:: Pulmonology [142]   Estimated Length of Stay: < 2 midnights   Tentative Discharge Plan?: Home or Self Care [1]   Does patient need telemetry?: Yes   Telemetry type (separate Telemetry order is also required):: Medical telemetry                This note was generated by the Epic EMR system/ Dragon speech recognition and may contain inherent errors or omissions not intended by the user. Grammatical errors, random word insertions, deletions and pronoun  errors  are occasional consequences of this technology due to software limitations. Not all errors are caught or corrected. If there are questions or concerns about the content of this note or information contained within the body of this dictation they should be addressed directly with the author for clarification     Tenny Craw, MD  08/03/19 [id number]

## 2019-08-03 NOTE — ED Provider Notes (Signed)
EMERGENCY DEPARTMENT NOTE     ED PHYSICIAN ASSIGNED     None          ED MIDLEVEL (APP) ASSIGNED     Date/Time Event User Comments    08/02/19 2039 PA/NP Provider Assigned Melburn Popper, [place] Trawick P. Toma Aran, FNP assigned as Nurse Practitioner          HISTORY OF PRESENT ILLNESS   Historian:Patient  Translator Used: No    Chief Complaint: Chest Pain and Shortness of Breath     Mechanism of Injury:       29 y.o. female with recent right pneumothorax with chest tube/admitted at [facility] 07/20/2019-07/22/2019 presents for sudden chest patient and DOE today at 10 AM, reports was walking with grandma when symptoms occurred. Denies exertion with walk. Does report stress/anxiety at that time. Denies fall, injury, abdominal symptoms.   Had chest tube removed when discharged on 07/22/2019.  Indicates was told had spontaneous pneumothorax, denies family history.     1. Location of symptoms: Chest pain and DOE  2. Onset of symptoms: Today at 10am  3. What was patient doing when symptoms started (Context): see above  4. Severity: moderate  5. Timing: sudden  6. Activities that worsen symptoms: exertion  7. Activities that improve symptoms: rest  8. Quality: 4/10 pain   9. Radiation of symptoms: no  10. Associated signs and Symptoms: see above  11. Are symptoms worsening? Yes        MEDICAL HISTORY     Past Medical History:  Past Medical History:   Diagnosis Date    Anal fissure     Gastroesophageal reflux disease 2014    Hemorrhoid        Past Surgical History:  Past Surgical History:   Procedure Laterality Date    CHEST TUBE INSERTION Right 07/2007       Social History:  Social History     Socioeconomic History    Marital status: Single     Spouse name: Not on file    Number of children: Not on file    Years of education: Not on file    Highest education level: Not on file   Occupational History    Not on file   Social Needs    Financial resource strain: Not on file    Food insecurity     Worry: Not on file      Inability: Not on file    Transportation needs     Medical: Not on file     Non-medical: Not on file   Tobacco Use    Smoking status: Former Smoker     Packs/day: 0.50     Quit date: 10/17/2017     Years since quitting: 1.7    Smokeless tobacco: Never Used   Substance and Sexual Activity    Alcohol use: Not Currently    Drug use: No    Sexual activity: Not Currently   Lifestyle    Physical activity     Days per week: Not on file     Minutes per session: Not on file    Stress: Not on file   Relationships    Social connections     Talks on phone: Not on file     Gets together: Not on file     Attends religious service: Not on file     Active member of club or organization: Not on file     Attends meetings of clubs or organizations: Not  on file     Relationship status: Not on file    Intimate partner violence     Fear of current or ex partner: Not on file     Emotionally abused: Not on file     Physically abused: Not on file     Forced sexual activity: Not on file   Other Topics Concern    Not on file   Social History Narrative    Not on file       Family History:  Family History   Problem Relation Age of Onset    Hypertension Paternal Grandmother     Diabetes Paternal Grandmother        Outpatient Medication:  Discharge Medication List as of 08/02/2019 11:37 PM      CONTINUE these medications which have NOT CHANGED    Details   docusate sodium (COLACE) 100 MG capsule Take 100 mg by mouth 2 (two) times daily., Historical Med      hydrocortisone (ANUSOL-HC) 25 MG suppository Place 1 suppository (25 mg total) rectally 2 (two) times daily., Starting Wed 07/30/2017, Print      ibuprofen (ADVIL,MOTRIN) 200 MG tablet Take 200 mg by mouth every 6 (six) hours as needed for Pain., Historical Med               REVIEW OF SYSTEMS   Review of Systems   Respiratory:        DOE   Cardiovascular: Positive for chest pain.   All other systems reviewed and are negative.         PHYSICAL EXAM     ED Triage Vitals   Enc Vitals  Group      BP 08/02/19 2048 126/70      Heart Rate 08/02/19 2048 89      Resp Rate 08/02/19 2048 18      Temp 08/02/19 2048 97.6 F (36.4 C)      Temp Source 08/02/19 2321 Oral      SpO2 08/02/19 2048 97 %      Weight 08/02/19 2048 103 kg      Height 08/02/19 2048 1.651 m      Head Circumference --       Peak Flow --       Pain Score 08/02/19 2148 0      Pain Loc --       Pain Edu? --       Excl. in GC? --      Vitals:    08/02/19 2048 08/02/19 2148 08/02/19 2200 08/02/19 2321   BP: 126/70 111/76 111/70 128/80   Pulse: 89 76 80 92   Resp: 18 18  20    Temp: 97.6 F (36.4 C)   99.1 F (37.3 C)   TempSrc:    Oral   SpO2: 97% 97% 97% 99%   Weight: 103 kg      Height: 5\' 5"  (1.651 m)        Physical Exam  Vitals signs and nursing note reviewed.   Constitutional:       Appearance: She is well-developed. She is obese. She is not toxic-appearing or diaphoretic.   HENT:      Head: Normocephalic and atraumatic.      Nose: Nose normal.      Mouth/Throat:      Mouth: Mucous membranes are moist.      Pharynx: Oropharynx is clear.   Eyes:      General: No scleral icterus.  Right eye: No discharge.         Left eye: No discharge.   Neck:      Musculoskeletal: Normal range of motion and neck supple. No neck rigidity.   Cardiovascular:      Rate and Rhythm: Normal rate and regular rhythm.      Pulses: Normal pulses.      Heart sounds: No murmur. No friction rub. No gallop.    Pulmonary:      Effort: No tachypnea.      Breath sounds: No stridor. Examination of the right-upper field reveals decreased breath sounds. Examination of the right-middle field reveals decreased breath sounds. Examination of the right-lower field reveals decreased breath sounds. Decreased breath sounds present. No wheezing, rhonchi or rales.   Abdominal:      General: There is no distension.      Palpations: There is no mass.      Tenderness: There is no abdominal tenderness. There is no guarding.   Musculoskeletal: Normal range of motion.          General: No swelling, tenderness, deformity or signs of injury.      Right lower leg: No edema.      Left lower leg: No edema.   Skin:     General: Skin is warm and dry.      Capillary Refill: Capillary refill takes less than 2 seconds.      Coloration: Skin is not jaundiced or pale.      Findings: No bruising, erythema or rash.      Comments: Healing incision scar to right lateral rib, 2 sutures placed, dry; no drainage, no surrounding erythema, no obvious crepitus. (patient reports healing well)   Neurological:      General: No focal deficit present.      Mental Status: She is alert and oriented to person, place, and time.      Gait: Gait normal.   Psychiatric:         Mood and Affect: Mood normal.         Behavior: Behavior normal.           MEDICAL DECISION MAKING     DISCUSSION    Mask, eye protection, gloves worn.  Declines pain medication at this time.  EKG to evaluate for arrhythmia, STEMI.  CBC to evaluate for infectious processes; CMP to evaluate liver, kidneys, electrolytes; troponin to evaluate for ACS; UA to evaluate for cystitis; hcg.  CXR to evaluate for pneumothorax, infiltrates, acute etiology.   Past charts reviewed, recent admission at [facility] for pneumothorax 07/20/2019-07/22/2019, chest tube removed upon discharge.     Chest xray independently read by me/NP, moderate right pneumothorax.   EKG independently read by me/NP, NSR.     Discussed patient with Dr. Kim/radiology, reports patient with recurrent right pneumothorax.   Placed on O2 to aid pneumothorax reabsorption.    Discussed results with patient and plan to transfer to [facility], patient indicates understanding and agreeable.   On re-evaluation patient AOx3, no obvious respiratory distress at this time, will have closely monitored.    Discussed patient with Dr. Frederico Hamman ER, accepts patient for ER-to-ER transfer.       Discussed patient and plan with Dr. Leane Platt.        The patient is NOT septic.  All labs and vital signs from the current visit  have been reviewed and any abnormality that is present is not due to sepsis.    Vital Signs: Reviewed the patients vital  signs.   Nursing Notes: Reviewed and utilized available nursing notes.  Medical Records Reviewed: Reviewed available past medical records.  Counseling: The emergency provider has spoken with the patient and discussed todays findings, in addition to providing specific details for the plan of care.  Questions are answered and there is agreement with the plan.            CARDIAC STUDIES    The following cardiac studies were independently interpreted by the Emergency Medicine Physician.  For full cardiac study results please see chart.    Monitor Strip  Interpreted by ED NP  Rate: 90  Rhythm: NSR   ST Changes: none    EKG Interpretation:  Signed and interpreted by NP   Time Interpreted: @2046   Comparison: 07/20/2019  Rate: 83  Rhythm: NSR  Axis: normal  Intervals: normal  Blocks: no blocks  ST segments: no elevation  Interpretation: Nonspecific  EKG, NSR      EMERGENCY IMAGING STUDIES    The following imagine studies were independently interpreted by me (emergency physician):    Radiology:  Interpreted by me (ED NP)  Study: Chest Xray   Results: Moderate right pneumothorax. No infiltrate. No hemothorax. No cardiomegaly. No CHF.  Impression: Moderate right pneumothorax.         RADIOLOGY IMAGING STUDIES      XR Chest 2 Views   Final Result         Marked interval enlargement of the right pneumothorax which is now   large.      Aldean Ast, MD    08/02/2019 10:39 PM              PULSE OXIMETRY    Oxygen Saturation by Pulse Oximetry: 99%  Interventions: none (5L NC to aid pneumothorax no hypoxia)  Interpretation:  WNL, interpreted by me.         EMERGENCY DEPT. MEDICATIONS      ED Medication Orders (From admission, onward)    None          LABORATORY RESULTS    Ordered and independently interpreted AVAILABLE laboratory tests. Please see results section in chart for full details.  Results for orders placed  or performed during the hospital encounter of 08/02/19   CBC and differential   Result Value Ref Range    WBC 6.41 3.10 - 9.50 x10 3/uL    Hgb 13.0 11.4 - 14.8 g/dL    Hematocrit 16.1 09.6 - 43.7 %    Platelets 305 142 - 346 x10 3/uL    RBC 4.24 3.90 - 5.10 x10 6/uL    MCV 91.5 78.0 - 96.0 fL    MCH 30.7 25.1 - 33.5 pg    MCHC 33.5 31.5 - 35.8 g/dL    RDW 12 11 - 15 %    MPV 10.0 8.9 - 12.5 fL    Neutrophils 53.3 None %    Lymphocytes Automated 36.7 None %    Monocytes 7.5 None %    Eosinophils Automated 1.7 None %    Basophils Automated 0.6 None %    Immature Granulocytes 0.2 None %    Nucleated RBC 0.0 0.0 - 0.0 /100 WBC    Neutrophils Absolute 3.42 1.10 - 6.33 x10 3/uL    Lymphocytes Absolute Automated 2.35 0.42 - 3.22 x10 3/uL    Monocytes Absolute Automated 0.48 0.21 - 0.85 x10 3/uL    Eosinophils Absolute Automated 0.11 0.00 - 0.44 x10 3/uL    Basophils Absolute Automated 0.04  0.00 - 0.08 x10 3/uL    Immature Granulocytes Absolute 0.01 0.00 - 0.07 x10 3/uL    Absolute NRBC 0.00 0.00 - 0.00 x10 3/uL   Comprehensive metabolic panel   Result Value Ref Range    Glucose 101 (H) 70 - 100 mg/dL    BUN 14 7 - 19 mg/dL    Creatinine 1.2 (H) 0.6 - 1.0 mg/dL    Sodium 540 981 - 191 mEq/L    Potassium 4.4 3.5 - 5.1 mEq/L    Chloride 107 100 - 111 mEq/L    CO2 23 22 - 29 mEq/L    Calcium 9.4 8.5 - 10.5 mg/dL    Protein, Total 7.5 6.0 - 8.3 g/dL    Albumin 4.3 3.5 - 5.0 g/dL    AST (SGOT) 16 5 - 34 U/L    ALT 21 0 - 55 U/L    Alkaline Phosphatase 100 37 - 106 U/L    Bilirubin, Total 0.2 0.2 - 1.2 mg/dL    Globulin 3.2 2.0 - 3.6 g/dL    Albumin/Globulin Ratio 1.3 0.9 - 2.2    Anion Gap 12.0 5.0 - 15.0   Troponin I   Result Value Ref Range    Troponin I <0.01 0.00 - 0.05 ng/mL   UA Reflex to Micro - Reflex to Culture   Result Value Ref Range    Urine Type Urine, Clean Ca     Color, UA Yellow Colorless - Yellow    Clarity, UA Sl Cloudy (A) Clear - Hazy    Specific Gravity UA 1.025 1.001 - 1.035    Urine pH 7.0 5.0 - 8.0     Leukocyte Esterase, UA Large (A) Negative    Nitrite, UA Negative Negative    Protein, UR 30 (A) Negative    Glucose, UA Negative Negative    Ketones UA Negative Negative    Urobilinogen, UA Negative 0.2 - 2.0 mg/dL    Bilirubin, UA Negative Negative    Blood, UA Negative Negative    WBC, UA 11 - 25 (A) 0 - 5 /hpf    Squamous Epithelial Cells, Urine 0 - 5 0 - 25 /hpf    Yeast, UA Rare (A) None   GFR   Result Value Ref Range    EGFR 53.5    Urine BHCG POC   Result Value Ref Range    Urine bHCG POC Negative Negative   ECG 12 Lead   Result Value Ref Range    Ventricular Rate 82 BPM    Atrial Rate 82 BPM    P-R Interval 116 ms    QRS Duration 90 ms    Q-T Interval 374 ms    QTC Calculation (Bezet) 436 ms    P Axis 38 degrees    R Axis 76 degrees    T Axis 58 degrees       CRITICAL CARE/PROCEDURES    Critical Care  Performed by: Toma Aran, FNP  Authorized by: Toma Aran, FNP     Critical care provider statement:     Critical care time (minutes):  45    Critical care start time:  08/02/2019 8:45 PM    Critical care end time:  08/02/2019 11:30 PM    Critical care time was exclusive of:  Separately billable procedures and treating other patients and teaching time    Critical care was necessary to treat or prevent imminent or life-threatening deterioration of the following conditions:  Respiratory failure  Critical care was time spent personally by me on the following activities:  Development of treatment plan with patient or surrogate, evaluation of patient's response to treatment, examination of patient, obtaining history from patient or surrogate, ordering and performing treatments and interventions, ordering and review of laboratory studies, ordering and review of radiographic studies, re-evaluation of patient's condition and review of old charts    I assumed direction of critical care for this patient from another provider in my specialty: no            DIAGNOSIS      Diagnosis:  Final diagnoses:    Pneumothorax, unspecified type       Disposition:  ED Disposition     ED Disposition Condition Date/Time Comment    Transfer to Another Facility  Mon Aug 02, 2019 11:06 PM [name] should be transferred out to [facility], Dr. Fran Lowes accepting.          Prescriptions:  Discharge Medication List as of 08/02/2019 11:37 PM      CONTINUE these medications which have NOT CHANGED    Details   docusate sodium (COLACE) 100 MG capsule Take 100 mg by mouth 2 (two) times daily., Historical Med      hydrocortisone (ANUSOL-HC) 25 MG suppository Place 1 suppository (25 mg total) rectally 2 (two) times daily., Starting Wed 07/30/2017, Print      ibuprofen (ADVIL,MOTRIN) 200 MG tablet Take 200 mg by mouth every 6 (six) hours as needed for Pain., Historical Med                Toma Aran, FNP  08/03/19 [phone]

## 2019-08-03 NOTE — Plan of Care (Addendum)
Pain management in progress with minimum relief obtained. Continent of bowel and bladder. Poor appetite with meals. Chest tube in place with no output noted  Problem: Pain  Goal: Pain at adequate level as identified by patient  Flowsheets (Taken 08/03/2019 1638)  Pain at adequate level as identified by patient:   Reassess pain within 30-60 minutes of any procedure/intervention, per Pain Assessment, Intervention, Reassessment (AIR) Cycle   Assess pain on admission, during daily assessment and/or before any "as needed" intervention(s)   Offer non-pharmacological pain management interventions     Problem: Moderate/High Fall Risk Score >5  Goal: Patient will remain free of falls  Flowsheets (Taken 08/03/2019 1638)  Moderate Risk (6-13): LOW-Fall Interventions Appropriate for Low Fall Risk     Problem: Inadequate Gas Exchange  Goal: Adequate oxygenation and improved ventilation  Flowsheets (Taken 08/03/2019 1638)  Adequate oxygenation and improved ventilation:   Assess lung sounds   Plan activities to conserve energy: plan rest periods   Increase activity as tolerated/progressive mobility   Monitor SpO2 and treat as needed

## 2019-08-03 NOTE — ED Triage Notes (Signed)
Pt. Transferring from [facility] for R side pneumothorax/chest tube insertion. Pt. Recently dx with spontaneous R side pneumothorax on feb 9 and has reinjury to same side. Pt 100% on 5L NC. Pain to R side chest/ribs.

## 2019-08-03 NOTE — H&P (Signed)
[facility]      Patient: [name]  Date: 08/03/2019   DOB: 12/29/1990  Admission Date: 08/03/2019   MRN: [id number]  Attending: Marya Amsler         Cc:  1. Right side CP  2. SOB     History Gathered From: Patient and EMS/ED.  I also reviewed hospital note dated on 07/22/2019.    HISTORY AND PHYSICAL     [name] is a 29 y.o. female with medical history of obesity and recent hospitalization (07/20/2019-02/11/20210) for right spontaneous pneumothorax required chest tube who presented with chest pain and shortness of breath.      Patient was somnolent during my evaluation likely due to sedation giving for chest tube insertion in ED (patient received Versed and morphine).  Patient reports that she is having mild right-sided chest pain associated with shortness of breath since this morning.  Patient denies fall, trauma, excessive cough, clotting disorder, COPD, cigarette smoking or using illicit drugs.  Patient denies fever, chills, change in urinary/bowel habit.  Patient was recently admitted for right spontaneous pneumothorax required chest tube.  In ED, found recurrent right side pneumothorax, chest tube placed in ED.    Past Medical History:   Diagnosis Date    Anal fissure     Gastroesophageal reflux disease 2014    Hemorrhoid        Past Surgical History:   Procedure Laterality Date    CHEST TUBE INSERTION Right 07/2007       Prior to Admission medications    Medication Sig Start Date End Date Taking? Authorizing Provider   docusate sodium (COLACE) 100 MG capsule Take 100 mg by mouth 2 (two) times daily.    [provider]   hydrocortisone (ANUSOL-HC) 25 MG suppository Place 1 suppository (25 mg total) rectally 2 (two) times daily. 07/30/17   Dora Sims, FNP   ibuprofen (ADVIL,MOTRIN) 200 MG tablet Take 200 mg by mouth every 6 (six) hours as needed for Pain.    [provider]       No Known Allergies    CODE STATUS: Full code.    PRIMARY CARE MD: Pcp,  None, MD    Family History   Problem Relation Age of Onset    Hypertension Paternal Grandmother     Diabetes Paternal Grandmother        Social History     Tobacco Use    Smoking status: Former Smoker     Packs/day: 0.50     Quit date: 10/17/2017     Years since quitting: 1.7    Smokeless tobacco: Never Used   Substance Use Topics    Alcohol use: Not Currently    Drug use: No       REVIEW OF SYSTEMS   Positive for: As in HPI.  Negative for: As in HPI  All ROS completed and otherwise negative.    PHYSICAL EXAM     Vital Signs (most recent): BP 108/66    Pulse 76    Temp 98.9 F (37.2 C) (Oral)    Resp 17    Ht 1.676 m (5\' 6" )    Wt 102.5 kg (226 lb)    LMP 10/31/2018    SpO2 100%    BMI 36.48 kg/m   Constitutional: Somnolent (seems due to sedation given for chest tube placement).   HEENT: NC/AT, no scleral icterus or conjunctival pallor, no nasal discharge, MMM.  Neck: trachea midline, supple, no cervical or  supraclavicular lymphadenopathy or masses.  Cardiovascular: RRR, normal S1 S2, no murmurs, gallops, palpable thrills, no JVD, Non-displaced PMI.  Respiratory: Normal rate. No retractions or increased work of breathing. Clear to auscultation bilaterally.  Gastrointestinal: +BS, non-distended, soft, non-tender, no rebound or guarding.  Genitourinary: No suprapubic tenderness.  Musculoskeletal: ROM and motor strength grossly normal.   Skin exam: Normal.  Neurologic: No gross motor or sensory deficits  Psychiatric: Somnolent but oriented x 3.   Capillary refill: Normal.    Exam done by Marya Amsler, MD on 08/03/19 at 2:31 AM.    LABS & IMAGING     Recent Results (from the past 24 hour(s))   ECG 12 Lead    Collection Time: 08/02/19  8:46 PM   Result Value Ref Range    Ventricular Rate 82 BPM    Atrial Rate 82 BPM    P-R Interval 116 ms    QRS Duration 90 ms    Q-T Interval 374 ms    QTC Calculation (Bezet) 436 ms    P Axis 38 degrees    R Axis 76 degrees    T Axis 58 degrees   CBC and differential    Collection  Time: 08/02/19  9:42 PM   Result Value Ref Range    WBC 6.41 3.10 - 9.50 x10 3/uL    Hgb 13.0 11.4 - 14.8 g/dL    Hematocrit 63.8 75.6 - 43.7 %    Platelets 305 142 - 346 x10 3/uL    RBC 4.24 3.90 - 5.10 x10 6/uL    MCV 91.5 78.0 - 96.0 fL    MCH 30.7 25.1 - 33.5 pg    MCHC 33.5 31.5 - 35.8 g/dL    RDW 12 11 - 15 %    MPV 10.0 8.9 - 12.5 fL    Neutrophils 53.3 None %    Lymphocytes Automated 36.7 None %    Monocytes 7.5 None %    Eosinophils Automated 1.7 None %    Basophils Automated 0.6 None %    Immature Granulocytes 0.2 None %    Nucleated RBC 0.0 0.0 - 0.0 /100 WBC    Neutrophils Absolute 3.42 1.10 - 6.33 x10 3/uL    Lymphocytes Absolute Automated 2.35 0.42 - 3.22 x10 3/uL    Monocytes Absolute Automated 0.48 0.21 - 0.85 x10 3/uL    Eosinophils Absolute Automated 0.11 0.00 - 0.44 x10 3/uL    Basophils Absolute Automated 0.04 0.00 - 0.08 x10 3/uL    Immature Granulocytes Absolute 0.01 0.00 - 0.07 x10 3/uL    Absolute NRBC 0.00 0.00 - 0.00 x10 3/uL   Comprehensive metabolic panel    Collection Time: 08/02/19  9:42 PM   Result Value Ref Range    Glucose 101 (H) 70 - 100 mg/dL    BUN 14 7 - 19 mg/dL    Creatinine 1.2 (H) 0.6 - 1.0 mg/dL    Sodium 433 295 - 188 mEq/L    Potassium 4.4 3.5 - 5.1 mEq/L    Chloride 107 100 - 111 mEq/L    CO2 23 22 - 29 mEq/L    Calcium 9.4 8.5 - 10.5 mg/dL    Protein, Total 7.5 6.0 - 8.3 g/dL    Albumin 4.3 3.5 - 5.0 g/dL    AST (SGOT) 16 5 - 34 U/L    ALT 21 0 - 55 U/L    Alkaline Phosphatase 100 37 - 106 U/L    Bilirubin, Total 0.2 0.2 -  1.2 mg/dL    Globulin 3.2 2.0 - 3.6 g/dL    Albumin/Globulin Ratio 1.3 0.9 - 2.2    Anion Gap 12.0 5.0 - 15.0   Troponin I    Collection Time: 08/02/19  9:42 PM   Result Value Ref Range    Troponin I <0.01 0.00 - 0.05 ng/mL   UA Reflex to Micro - Reflex to Culture    Collection Time: 08/02/19  9:42 PM   Result Value Ref Range    Urine Type Urine, Clean Ca     Color, UA Yellow Colorless - Yellow    Clarity, UA Sl Cloudy (A) Clear - Hazy    Specific  Gravity UA 1.025 1.001 - 1.035    Urine pH 7.0 5.0 - 8.0    Leukocyte Esterase, UA Large (A) Negative    Nitrite, UA Negative Negative    Protein, UR 30 (A) Negative    Glucose, UA Negative Negative    Ketones UA Negative Negative    Urobilinogen, UA Negative 0.2 - 2.0 mg/dL    Bilirubin, UA Negative Negative    Blood, UA Negative Negative    WBC, UA 11 - 25 (A) 0 - 5 /hpf    Squamous Epithelial Cells, Urine 0 - 5 0 - 25 /hpf    Yeast, UA Rare (A) None   GFR    Collection Time: 08/02/19  9:42 PM   Result Value Ref Range    EGFR 53.5    Urine BHCG POC    Collection Time: 08/02/19  9:52 PM   Result Value Ref Range    Urine bHCG POC Negative Negative       MICROBIOLOGY:  Blood Culture: NA  Urine Culture: NA  Antibiotics Started: N    IMAGING:  Upon my review: As below.    CARDIAC:  EKG Interpretation (upon my review):  As below.     Markers:  Recent Labs   Lab 08/02/19  2142   Troponin I <0.01       EMERGENCY DEPARTMENT COURSE:  Orders Placed This Encounter   Procedures    COVID-19 (SARS-COV-2) ([facility])    Chest AP Portable    Basic Metabolic Panel    CBC and differential    Prothrombin time/INR    Diet regular    Chest tube to continuous suction    Pulse Oximetry    Progressive Mobility Protocol    Notify physician    I/O    Height    Weight    Skin assessment    Place sequential compression device    Maintain sequential compression device    Education: Activity    Education: Disease Process & Condition    Education: Pain Management    Education: Falls Risk    Education: Smoking Cessation    Telemetry 24 Hour Protocol    Full Code    ED Unit Sec Comm Order    Saline lock IV    Admit to Observation       ASSESSMENT & PLAN     [name] is a 29 y.o. female with medical history of obesity and recent hospitalization (07/20/2019-02/11/20210) for right spontaneous pneumothorax required chest tube admitted with recurrent right-sided pneumothorax.      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 DDx; LAM, PLCH, vasculitis, infection or malignancy.  # Recent hospitalization: (07/20/2019-02/11/20210) for  right spontaneous pneumothorax required chest tube  # R side CP  # Dyspnea  # AKI: Cr 1.2 POA, last was 0.8 on 07/22/19  # Abnormal UA: + for UTI. Patient asymptomatic  # COVID: Pending    -CXR  # 1. Marked interval enlargement of the right pneumothorax which is now large.  # 2. Resolved pneumothorax status post right chest tube placement.  - Reviewed EKG: NSR.  Normal axis.  No acute ST changes.    -CT chest was done on 07/20/19: Small residual right pneumothorax.  Atelectasis versus infiltrate in the dependent portion of the lungs.    -ICU consulted by ED.   -Will repeat CXR this morning, please follow.  -Pulse oximetry and supplemental oxygen via nasal cannula.  -Pain management with IV morphine so patient can breathe deeply.  -Will check HIV and ANCA.  -Will check procalcitonin  -Will avoid nephrotoxic toxins and repeat renal functions.  -Will hold Abx    Nutrition  Regular.    DVT/VTE Prophylaxis  SCD/No [facility] tonight     Anticipated medical stability for discharge: 1-2 days.    Service status/Reason for ongoing hospitalization: Observation/recurrent pneumothorax.  Anticipated Discharge Needs: To be determined.    Signed,  Marya Amsler    Time Elapsed: 45 min.    This note was generated by the Epic EMR system/ Dragon speech recognition and may contain inherent errors or omissions not intended by the user.

## 2019-08-03 NOTE — UM Notes (Signed)
UTILIZATION REVIEW CONTACT: Name:  Fuller Plan MSN RN CCM   Utilization Review Case Manager    [facility]  Address:  [address] , [place] ,[place] [address]  NPI:   [id number]  Tax ID:  [id number]  Phone: [phone]  Fax: [phone]  Email: [email or web address]    Auth Number : [id number]/A    ER ADMIT DATE AND TIME: 08/03/2019 12:00 AM  OBS admit:  08/03/19 3244      PATIENT NAME: [name],[name]  DOB: 1990-12-09      ADMISSION REVIEW   History of present illness: Pt is a 29 y.o. female arrived at [facility] on 08/03/2019 at 0000.    Complaints:    Chief Complaint   Patient presents with    Chest Injury     R side     The pt has a  past medical history significant for gastroesophageal reflux disease and pneumothorax who was recently discharged from the hospital came back with chest pain and shortness of breath and found to have reaccumulation of her right pneumothorax.  Chest tube was placed in the emergency room.  At present she is awake alert and feeling better    PMH:  has a past medical history of Anal fissure, Gastroesophageal reflux disease (2014), and Hemorrhoid.  PSH:  has a past surgical history that includes Chest tube insertion (Right, 07/2007).      DIAGNOSIS:     ICD-10-CM    1. Recurrent spontaneous pneumothorax  J93.83          VS:   Vitals:    08/03/19 1142   BP: 107/63   Pulse: 72   Resp: 20   Temp: 97.7 F (36.5 C)   SpO2: 98%          LABS     Radiologic Studies -   Xr Chest 2 Views    Result Date: 08/02/2019  Marked interval enlargement of the right pneumothorax which is now large. Aldean Ast, MD  08/02/2019 10:39 PM    Xr Chest Ap Portable    Result Date: 08/03/2019  Small apical pneumothorax in the setting of a right-sided chest tube. Aldean Ast, MD  08/03/2019 8:35 AM    Chest Ap Portable    Result Date: 08/03/2019  Resolved pneumothorax status post right chest tube placement. Adaline Sill, MD  08/03/2019 1:42 AM         ED meds:    Date/Time Order Dose Route Action    08/03/2019  0051 midazolam (VERSED) injection 5 mg 5 mg Intravenous Given    08/03/2019 0036 morphine injection 2 mg 2 mg Intravenous Given    08/03/2019 0036 ketorolac (TORADOL) injection 15 mg 15 mg Intravenous Given    08/03/2019 0108 midazolam (VERSED) injection 5 mg 2.5 mg Intravenous Given    08/03/2019 0230 morphine injection 4 mg 4 mg Intravenous Given           MD NOTES:  H&P  Gursimran Litaker is a 29 y.o. female with medical history of obesity and recent hospitalization (07/20/2019-02/11/20210) for right spontaneous pneumothorax required chest tube admitted with recurrent right-sided pneumothorax.      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 DDx; LAM, PLCH, vasculitis, infection or malignancy.  # Recent hospitalization: (07/20/2019-02/11/20210) for right spontaneous pneumothorax required chest tube  # R side CP  # Dyspnea  # AKI: Cr  1.2 POA, last was 0.8 on 07/22/19  # Abnormal UA: + for UTI. Patient asymptomatic  # COVID: Pending    -CXR  # 1. Marked interval enlargement of the right pneumothorax which is now large.  # 2. Resolved pneumothorax status post right chest tube placement.  - Reviewed EKG: NSR.  Normal axis.  No acute ST changes.    -CT chest was done on 07/20/19: Small residual right pneumothorax. Atelectasis versus infiltrate in the dependent portion of the lungs.    -ICU consulted by ED.   -Will repeat CXR this morning, please follow.  -Pulse oximetry and supplemental oxygen via nasal cannula.  -Pain management with IV morphine so patient can breathe deeply.  -Will check HIV and ANCA.  -Will check procalcitonin  -Will avoid nephrotoxic toxins and repeat renal functions.  -Will hold Abx    Nutrition  Regular.    DVT/VTE Prophylaxis  SCD/No [facility] tonight     Anticipated medical stability for discharge: 1-2 days.  Service status/Reason for ongoing  hospitalization: Observation/recurrent pneumothorax.    CRITICAL CARE CONSULT NOTES  Assesment:     .  Recurrent spontaneous pneumothorax  .  Chest pain  Plan:   Recurrent right pneumothorax, chest tube in place, will place chest tube to waterseal, monitor chest x-ray  .  Continue oxygen  .  Pain medications as tolerated      Current Medications:  Scheduled Meds:  Current Facility-Administered Medications   Medication Dose Route Frequency     Continuous Infusions:   sodium chloride 75 mL/hr at 08/03/19 0928     PRN Meds:.acetaminophen **OR** acetaminophen, ketorolac, midazolam, morphine, naloxone, ondansetron **OR** ondansetron, oxyCODONE

## 2019-08-03 NOTE — Consults (Signed)
Pulmonary Critical Care CONSULTATION    Date Time: 08/03/19 11:48 AM  Patient Name: [name]  Requesting Physician: Gwyndolyn Kaufman, MD      Assesment:     .  Recurrent spontaneous pneumothorax  .  Chest pain    Plan:     .  Recurrent right pneumothorax, chest tube in place, will place chest tube to waterseal, monitor chest x-ray  .  Continue oxygen  .  Pain medications as tolerated    Discussed with patient in detail  Discussed with bedside nurse      History:   [name] is a 29 y.o. female with past medical history significant for gastroesophageal reflux disease and pneumothorax who was recently discharged from the hospital came back with chest pain and shortness of breath and found to have reaccumulation of her right pneumothorax.  Chest tube was placed in the emergency room.  At present she is awake alert and feeling better    Past Medical History:     Past Medical History:   Diagnosis Date    Anal fissure     Gastroesophageal reflux disease 2014    Hemorrhoid        Past Surgical History:     Past Surgical History:   Procedure Laterality Date    CHEST TUBE INSERTION Right 07/2007       Family History:     Family History   Problem Relation Age of Onset    Hypertension Paternal Grandmother     Diabetes Paternal Grandmother        Social History:     Social History     Socioeconomic History    Marital status: Single     Spouse name: Not on file    Number of children: Not on file    Years of education: Not on file    Highest education level: Not on file   Occupational History    Not on file   Social Needs    Financial resource strain: Not on file    Food insecurity     Worry: Not on file     Inability: Not on file    Transportation needs     Medical: Not on file     Non-medical: Not on file   Tobacco Use    Smoking status: Former Smoker     Packs/day: 0.50     Quit date: 10/17/2017     Years since quitting: 1.7    Smokeless tobacco: Never Used   Substance and Sexual  Activity    Alcohol use: Not Currently    Drug use: No    Sexual activity: Not Currently   Lifestyle    Physical activity     Days per week: Not on file     Minutes per session: Not on file    Stress: Not on file   Relationships    Social connections     Talks on phone: Not on file     Gets together: Not on file     Attends religious service: Not on file     Active member of club or organization: Not on file     Attends meetings of clubs or organizations: Not on file     Relationship status: Not on file    Intimate partner violence     Fear of current or ex partner: Not on file     Emotionally abused: Not on file     Physically abused: Not  on file     Forced sexual activity: Not on file   Other Topics Concern    Not on file   Social History Narrative    Not on file       Allergies:   No Known Allergies    Hospital Medications:     Current Facility-Administered Medications   Medication Dose Route Frequency       Home Medications:     Medications Prior to Admission   Medication Sig Dispense Refill Last Dose    oxyCODONE-acetaminophen (PERCOCET) 7.5-325 MG per tablet Take 1 tablet by mouth every 4 (four) hours as needed for Pain       docusate sodium (COLACE) 100 MG capsule Take 100 mg by mouth 2 (two) times daily.       hydrocortisone (ANUSOL-HC) 25 MG suppository Place 1 suppository (25 mg total) rectally 2 (two) times daily. 10 suppository 0     ibuprofen (ADVIL,MOTRIN) 200 MG tablet Take 200 mg by mouth every 6 (six) hours as needed for Pain.          Review of Systems:   General ROS: negative for - chills, fatigue, fever or night sweats  Ophthalmic ROS: negative for - double vision, excessive tearing, itchy eyes or photophobia  ENT ROS: negative for - headaches, nasal congestion, sore throat or vertigo  Respiratory ROS: negative for - cough, hemoptysis, orthopnea, shortness of breath has improved, tachypnea or wheezing  Cardiovascular ROS: negative for - chest pain, edema, orthopnea, rapid heart rate    Gastrointestinal ROS: negative for - abdominal pain, blood in stools, constipation, diarrhea, heartburn or nausea/vomiting  Musculoskeletal ROS: negative for - muscle pain  Neurological ROS: negative for - confusion, dizziness, headaches, speech problems, visual changes or weakness  Dermatological ROS: negative for dry skin, pruritus and rash    Physical Exam:   BP 107/63    Pulse 72    Temp 97.7 F (36.5 C) (Axillary)    Resp 20    Ht 1.676 m (5\' 6" )    Wt 102.5 kg (226 lb)    LMP 10/31/2018    SpO2 98%    BMI 36.48 kg/m     Intake and Output Summary (Last 24 hours) at Date Time  No intake or output data in the 24 hours ending 08/03/19 1148    General appearance - alert,  and in no distress  Mental status - alert, oriented to person, place, and time  Eyes - pupils equal and reactive, extraocular eye movements intact  Ears - no external ear lesions seen  Nose - no nasal discharge   Mouth - clear oral mucosa, moist   Neck - supple, no significant adenopathy  Chest - clear to auscultation, no wheezes, rales or rhonchi, symmetric air entry  Heart - normal rate, regular rhythm, normal S1, S2, no murmurs, rubs, clicks or gallops  Abdomen - soft, nontender, nondistended, no masses or organomegaly  Neurological - alert, oriented, normal speech, no focal findings or movement disorder noted  Musculoskeletal - no joint swelling or tenderness  Extremities -  no pedal edema, no clubbing or cyanosis  Skin - normal coloration, no rashes    Labs Reviewed:     Results     Procedure Component Value Units Date/Time    HIV-1/2 Ag/Ab 4th Gen. w/ Reflex [[id number]] Collected: 08/03/19 0427    Specimen: Blood Updated: 08/03/19 1042     HIV Ag/Ab, 4th Generation Non-Reactive    GFR [[id number]] Collected: 08/03/19 4742  Updated: 08/03/19 0548     EGFR >60.0    Basic Metabolic Panel [[id number]]  (Abnormal) Collected: 08/03/19 0427    Specimen: Blood Updated: 08/03/19 0548     Glucose 113 mg/dL      BUN 40.9 mg/dL      Creatinine 0.9  mg/dL      Calcium 9.1 mg/dL      Sodium 811 mEq/L      Potassium 4.7 mEq/L      Chloride 109 mEq/L      CO2 20 mEq/L      Anion Gap 11.0    Hemolysis index [[id number]] Collected: 08/03/19 0427     Updated: 08/03/19 0548     Hemolysis Index 8    Prothrombin time/INR [[id number]]  (Abnormal) Collected: 08/03/19 0427    Specimen: Blood Updated: 08/03/19 0528     PT 12.4 sec      PT INR 0.9    CBC and differential [[id number]] Collected: 08/03/19 0427    Specimen: Blood Updated: 08/03/19 0520     WBC 7.77 x10 3/uL      Hgb 12.5 g/dL      Hematocrit 41.3 %      Platelets 334 x10 3/uL      RBC 4.18 x10 6/uL      MCV 92.3 fL      MCH 29.9 pg      MCHC 32.4 g/dL      RDW 12 %      MPV 9.3 fL      Neutrophils 73.1 %      Lymphocytes Automated 19.9 %      Monocytes 5.4 %      Eosinophils Automated 0.8 %      Basophils Automated 0.5 %      Immature Granulocytes 0.3 %      Nucleated RBC 0.0 /100 WBC      Neutrophils Absolute 5.68 x10 3/uL      Lymphocytes Absolute Automated 1.55 x10 3/uL      Monocytes Absolute Automated 0.42 x10 3/uL      Eosinophils Absolute Automated 0.06 x10 3/uL      Basophils Absolute Automated 0.04 x10 3/uL      Immature Granulocytes Absolute 0.02 x10 3/uL      Absolute NRBC 0.00 x10 3/uL     ANCA Panel for Vasculitis, S [[id number]] Collected: 08/03/19 0427     Updated: 08/03/19 0516    COVID-19 (SARS-COV-2) ([facility]) [[id number]] Collected: 08/03/19 0238    Specimen: Nasopharyngeal Swab from Nasopharynx Updated: 08/03/19 0314     Purpose of COVID testing Screening     SARS-CoV-2 Specimen Source Nasopharyngeal     SARS CoV 2 Overall Result Negative    Narrative:      o Collect and clearly label specimen type:  o Upper respiratory specimen: One Nasopharyngeal Dry Swab NO  Transport Media.  o Hand deliver to laboratory ASAP  Indication for testing->Extended care facility admission to  semi private room  Rescheduled by [id number] at 08/03/2019 02:35 Reason: Printed by   mistake/Printing Issues.          Rads:    Radiological Procedure reviewed.   Radiology Results (24 Hour)     Procedure Component Value Units Date/Time    XR Chest AP Portable [[id number]] Collected: 08/03/19 0834    Order Status: Completed Updated: 08/03/19 0837    Narrative:      INDICATION: PTX    TECHNIQUE: Portable AP radiograph of the chest.  COMPARISON: 08/03/2019.    FINDINGS: Stable right-sided chest tube. Small apical right  pneumothorax. No evidence of pleural effusion. Clear lungs. No  cardiomegaly. The upper abdomen and regional osseous structures  demonstrate no acute abnormality.      Impression:          Small apical pneumothorax in the setting of a right-sided chest tube.    Aldean Ast, MD   08/03/2019 8:35 AM    Chest AP Portable [[id number]] Collected: 08/03/19 0141    Order Status: Completed Updated: 08/03/19 0144    Narrative:      Examination: Portable chest.    HISTORY: Chest tube placement.    COMPARISON: 08/02/2019.    FINDINGS:  Right apical lateral chest tube in place.  Reexpansion right lung.  Resolved right pneumothorax.  Low lung volumes.  Subsegmental atelectasis right lung base.  Elevation hemidiaphragms.  Heart size normal.  Bones unremarkable.      Impression:        Resolved pneumothorax status post right chest tube placement.    Adaline Sill, MD   08/03/2019 1:42 AM        Signed by: Warden Fillers

## 2019-08-03 NOTE — Progress Notes (Signed)
Patient complained of chest pressure at 1845, denied chest pain, MD Chinery made aware, MD ordered a chest X-ray.

## 2019-08-03 NOTE — Progress Notes (Signed)
Patient is breastfeeding, she said her breast feels tight, Clinical research associate borrowed breast pump from Labor and delivery for patient. Breast bump has been delivered to patient.

## 2019-08-03 NOTE — ED Notes (Signed)
Bed: CR4  Expected date:   Expected time:   Means of arrival:   Comments:  [facility] ED Transfer

## 2019-08-03 NOTE — Plan of Care (Signed)
Problem: Pain  Goal: Pain at adequate level as identified by patient  Outcome: Progressing  Flowsheets (Taken 08/03/2019 0548)  Pain at adequate level as identified by patient:   Assess for risk of opioid induced respiratory depression, including snoring/sleep apnea. Alert healthcare team of risk factors identified.   Evaluate if patient comfort function goal is met   Offer non-pharmacological pain management interventions   Assess pain on admission, during daily assessment and/or before any "as needed" intervention(s)   Reassess pain within 30-60 minutes of any procedure/intervention, per Pain Assessment, Intervention, Reassessment (AIR) Cycle     Problem: Moderate/High Fall Risk Score >5  Goal: Patient will remain free of falls  Outcome: Progressing  Flowsheets (Taken 08/03/2019 0548)  High (Greater than 13):   HIGH-Initiate use of floor mats as appropriate   HIGH-Apply yellow "Fall Risk" arm band   HIGH-Bed alarm on at all times while patient in bed     Problem: Inadequate Gas Exchange  Goal: Adequate oxygenation and improved ventilation  Outcome: Progressing  Flowsheets (Taken 08/03/2019 0548)  Adequate oxygenation and improved ventilation:   Assess lung sounds   Monitor SpO2 and treat as needed   Position for maximum ventilatory efficiency   Provide mechanical and oxygen support to facilitate gas exchange  Goal: Patent Airway maintained  Outcome: Progressing  Flowsheets (Taken 08/03/2019 0548)  Patent airway maintained:   Position patient for maximum ventilatory efficiency   Reposition patient every 2 hours and as needed unless able to self-reposition    Admitted from the ED at 0336 via stretcher, received with right anterior chest tube (thora-vent) to continuous suction, dressing C/D/I.

## 2019-08-04 ENCOUNTER — Observation Stay: Payer: Medicaid Other

## 2019-08-04 LAB — BASIC METABOLIC PANEL
Anion Gap: 8 (ref 5.0–15.0)
BUN: 12 mg/dL (ref 7.0–19.0)
CO2: 23 mEq/L (ref 22–29)
Calcium: 8.8 mg/dL (ref 8.5–10.5)
Chloride: 108 mEq/L (ref 100–111)
Creatinine: 0.8 mg/dL (ref 0.6–1.0)
Glucose: 127 mg/dL — ABNORMAL HIGH (ref 70–100)
Potassium: 3.8 mEq/L (ref 3.5–5.1)
Sodium: 139 mEq/L (ref 136–145)

## 2019-08-04 LAB — GFR: EGFR: >60

## 2019-08-04 LAB — HEMOLYSIS INDEX: Hemolysis Index: 15 (ref 0–18)

## 2019-08-04 NOTE — Progress Notes (Signed)
PULM. CCM PROGRESS NOTE    Date Time: 08/04/19 11:46 AM  Patient Name: [name],[name]      Assessment:     .  Recurrent right pneumothorax  .  Chest pain    Plan:     .  Status post chest tube placement, reviewed chest x-ray, still have a small apical pneumothorax, continue wall suction  .  Pain meds as tolerated    Discussed with patient in detail    Subjective:   Patient is a 29 y.o. female who is awake, alert, and comfortable.     Medications:     Current Facility-Administered Medications   Medication Dose Route Frequency    cefTRIAXone  1 g Intravenous Q24H       Review of Systems:     General ROS: negative for - chills, fever or night sweats  Ophthalmic ROS: negative for - double vision, excessive tearing, itchy eyes or photophobia  ENT ROS: negative for - headaches, nasal congestion, sore throat or vertigo  Respiratory ROS: negative for - cough, hemoptysis, orthopnea, improving pleuritic pain and shortness of breath, no  wheezing  Cardiovascular ROS: negative for - chest pain, edema, orthopnea, rapid heart rate  Gastrointestinal ROS: negative for - abdominal pain, blood in stools, constipation, diarrhea, heartburn or nausea/vomiting  Musculoskeletal ROS: negative for - muscle pain  Neurological ROS: negative for - confusion, dizziness, headaches, speech problems, visual changes or weakness  Dermatological ROS: negative for dry skin, pruritus and rash    Physical Exam:   BP 111/72    Pulse 67    Temp 98.1 F (36.7 C) (Oral)    Resp 17    Ht 1.676 m (5\' 6" )    Wt 103.4 kg (228 lb)    LMP 10/31/2018    SpO2 100%    BMI 36.80 kg/m     Intake and Output Summary (Last 24 hours) at Date Time    Intake/Output Summary (Last 24 hours) at 08/04/2019 1146  Last data filed at 08/03/2019 1940  Gross per 24 hour   Intake 1125 ml   Output 1200 ml   Net -75 ml       General appearance - alert,  and in no distress  Mental status - alert, oriented to person, place, and time  Eyes - pupils equal and reactive, extraocular  eye movements intact  Ears - no external ear lesions seen  Nose - no nasal discharge   Mouth - clear oral mucosa, moist   Neck - supple, no significant adenopathy  Chest - clear to auscultation, no wheezes, rales or rhonchi, symmetric air entry  Heart - normal rate, regular rhythm, normal S1, S2, no murmurs, rubs, clicks or gallops  Abdomen - soft, nontender, nondistended, no masses or organomegaly  Neurological - alert, oriented, normal speech, no focal findings or movement disorder noted  Musculoskeletal - no joint swelling or tenderness  Extremities -  no pedal edema, no clubbing or cyanosis  Skin - normal coloration, no rashes    Labs:     Recent Labs   Lab 08/03/19  0427 08/02/19  2142   WBC 7.77 6.41   Hgb 12.5 13.0   Hematocrit 38.6 38.8   Platelets 334 305   MCV 92.3 91.5   Neutrophils 73.1 53.3     Recent Labs   Lab 08/04/19  0957 08/03/19  0427 08/02/19  2142   Sodium 139 140 142   Potassium 3.8 4.7 4.4   Chloride 108 109 107  CO2 23 20* 23   BUN 12.0 16.0 14   Creatinine 0.8 0.9 1.2*   Glucose 127* 113* 101*   Calcium 8.8 9.1 9.4   Protein, Total  --   --  7.5   Albumin  --   --  4.3   AST (SGOT)  --   --  16   ALT  --   --  21   Alkaline Phosphatase  --   --  100   Bilirubin, Total  --   --  0.2     Glucose:    Recent Labs   Lab 08/04/19  0957 08/03/19  0427 08/02/19  2142   Glucose 127* 113* 101*     Recent Labs   Lab 08/03/19  0427   PT 12.4*   PT INR 0.9       Rads:   Radiological Procedure reviewed.  Radiology Results (24 Hour)     Procedure Component Value Units Date/Time    XR Chest AP Portable [[id number]] Collected: 08/04/19 1036    Order Status: Completed Updated: 08/04/19 1040    Narrative:      HISTORY: 29 year old female with acute right-sided chest pain,  pneumothorax, shortness of breath.    COMPARISON: Prior studies and reports from one day earlier.    FINDINGS: Single AP portable erect radiograph of the chest was  performed. Heart is not enlarged. There is no shift of the heart  or  mediastinum. Right-sided chest tube is noted in position with minimal  residual right apical pneumothorax. No pleural effusion or atelectasis  is seen. Trachea is judged unremarkable.      Impression:       Minimal right apical pneumothorax without acute complication  or change compared to February 23. Chest tube in position.    Charlene Brooke, MD   08/04/2019 10:38 AM    XR Chest AP Portable [[id number]] Collected: 08/03/19 1901    Order Status: Completed Updated: 08/03/19 1907    Narrative:      XR CHEST AP PORTABLE    CLINICAL INDICATION:   chest  pressure    COMPARISON: 08/03/2019      Impression:           The cardiomediastinal silhouette has not shown significant interval  change allowing for differences in patient positioning. Right pleural  catheter does not appear significantly changed. There has been interval  decrease in right pneumothorax with small residual right apical  pneumothorax. Nodular density projecting over the left upper lobe may be  artifactual. The lungs otherwise appear clear. There is no evidence for  pleural effusion.     Sandie Ano, MD   08/03/2019 7:05 PM    XR Chest AP Portable [[id number]] Collected: 08/03/19 1614    Order Status: Completed Updated: 08/03/19 1618    Narrative:      XR CHEST AP PORTABLE    CLINICAL INDICATION:   pneumothorax    COMPARISON: 08/03/2019      Impression:           The cardiomediastinal silhouette has not shown significant interval  change allowing for differences in patient positioning. Right pleural  catheter does not appear significantly changed. Previously identified  small right pneumothorax has shown mild interval increase in size.  Linear opacity projecting over the right lung base does not appear  significantly changed. The left lung appears clear. There is no evidence  for pneumothorax on the left.     Sandie Ano, MD   08/03/2019  4:16 PM            Lattie Haw, MD  Pulmonary and critical care  08/04/19

## 2019-08-04 NOTE — Plan of Care (Signed)
Problem: Pain  Goal: Pain at adequate level as identified by patient  Outcome: Progressing  Flowsheets (Taken 08/03/2019 1638 by Karl Ito, RN)  Pain at adequate level as identified by patient:   Reassess pain within 30-60 minutes of any procedure/intervention, per Pain Assessment, Intervention, Reassessment (AIR) Cycle   Assess pain on admission, during daily assessment and/or before any "as needed" intervention(s)   Offer non-pharmacological pain management interventions     Problem: Moderate/High Fall Risk Score >5  Goal: Patient will remain free of falls  Outcome: Progressing  Flowsheets (Taken 08/03/2019 2000)  Moderate Risk (6-13):   LOW-Fall Interventions Appropriate for Low Fall Risk   MOD-Remain with patient during toileting     Problem: Inadequate Gas Exchange  Goal: Adequate oxygenation and improved ventilation  Outcome: Progressing  Flowsheets (Taken 08/03/2019 1638 by Karl Ito, RN)  Adequate oxygenation and improved ventilation:   Assess lung sounds   Plan activities to conserve energy: plan rest periods   Increase activity as tolerated/progressive mobility   Monitor SpO2 and treat as needed  Goal: Patent Airway maintained  Outcome: Progressing  Flowsheets (Taken 08/03/2019 0548 by Harrison Mons, RN)  Patent airway maintained:   Position patient for maximum ventilatory efficiency   Reposition patient every 2 hours and as needed unless able to self-reposition     Chest tube remains connected to suction, no output noted. Pt receiving oxycodone for pain. Breast pump at bedside. Pt reporting improvement to chest pressure with pumping.

## 2019-08-04 NOTE — Progress Notes (Signed)
[facility]  PROGRESS NOTE      Patient: [name]  Date: 08/04/2019   LOS: 0 Days  Admission Date: 08/03/2019   MRN: [id number]  Attending: Gwyndolyn Kaufman  Please contact me on the following Spectralink [phone]       ASSESSMENT/PLAN     [name] is a 29 y.o. 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)  she is status post chest tube placement  Analgesics for pain   a noncontrast CT scan of the chest was done on previous admission which was negative for structural process  A D-dimer was normal at that time as well   Alpha 1 antitrypsin level was also normal  Urine rapid drug screen negative    Patient had episode of chest pressure on previous day  Follow-up x-ray revealed worsening of pneumothorax after chest tube was placed on waterseal  Patient back on low intermittent suction with follow-up x-ray revealed improvement in pneumothorax  No chest pressure this morning    Acute kidney injury  Most likely due to volume depletion  Resolved with IV fluid hydration    Obesity  Diet exercise weight loss and lifestyle modification    Hypoglycemia  Hemoglobin A1c    Rule out UTI  Check urine culture  Empiric antibiotics in the interim.    We will discuss further management with pulmonary medicine  Discussed plan with patient    Discussed with patient and bedside RN    Analgesia: Morphine,  oxycodone and Tylenol    Nutrition: Regular diet    DVT Prophylaxis: SCDs       Code Status: Full code    DISPO: To be determined    Family Contact: Patient's grandmother       SUBJECTIVE     [name] states she has no dyspnea   complains of pain at the site of a chest tube otherwise no new complaints    MEDICATIONS     Current Facility-Administered Medications   Medication Dose Route Frequency    cefTRIAXone  1 g Intravenous Q24H       PHYSICAL EXAM     Vitals:    08/04/19 0734    BP: 111/72   Pulse: 67   Resp: 17   Temp: 98.1 F (36.7 C)   SpO2: 100%       Temperature: Temp  Min: 97.2 F (36.2 C)  Max: 98.2 F (36.8 C)  Pulse: Pulse  Min: 56  Max: 73  Respiratory: Resp  Min: 12  Max: 20  Non-Invasive BP: BP  Min: 103/65  Max: 111/72  Pulse Oximetry SpO2  Min: 98 %  Max: 100 %    Intake and Output Summary (Last 24 hours) at Date Time    Intake/Output Summary (Last 24 hours) at 08/04/2019 0746  Last data filed at 08/03/2019 1940  Gross per 24 hour   Intake 1125 ml   Output 1200 ml   Net -75 ml       GEN APPEARANCE: Obese female in no acute distress A&OX3  HEENT: PERLA; EOMI; Conjunctiva Clear  NECK: Supple; No bruits  CVS: RRR, S1, S2; No M/G/R  LUNGS: CTAB; No Wheezes; No Rhonchi: No rales  ABD: Soft; No TTP; + Normoactive BS  EXT: No edema; Pulses 2+ and intact  Skin exam: Warm and dry  NEURO: CN 2-12 intact; No Focal neurological  deficits  CAP REFILL: Less than 3 seconds capillary refill  MENTAL STATUS: Awake alert and oriented x3          LABS     Recent Labs   Lab 08/03/19  0427 08/02/19  2142   WBC 7.77 6.41   RBC 4.18 4.24   Hgb 12.5 13.0   Hematocrit 38.6 38.8   MCV 92.3 91.5   Platelets 334 305       Recent Labs   Lab 08/03/19  0427 08/02/19  2142   Sodium 140 142   Potassium 4.7 4.4   Chloride 109 107   CO2 20* 23   BUN 16.0 14   Creatinine 0.9 1.2*   Glucose 113* 101*   Calcium 9.1 9.4       Recent Labs   Lab 08/02/19  2142   ALT 21   AST (SGOT) 16   Bilirubin, Total 0.2   Albumin 4.3   Alkaline Phosphatase 100       Recent Labs   Lab 08/02/19  2142   Troponin I <0.01       Recent Labs   Lab 08/03/19  0427   PT INR 0.9   PT 12.4*       Microbiology Results     Procedure Component Value Units Date/Time    Adult Urine culture [[id number]] Collected: 08/03/19 1341    Specimen: Urine, Clean Catch Updated: 08/03/19 1341    Narrative:      R/O UTI  ABNORMAL U/A  Replace urinary catheter prior to obtaining the urine culture  if it has been in place for greater than or equal to  14  days:->N/A No Foley  Indications for Urine Culture:->Other (please specify in  Comments)    COVID-19 (SARS-COV-2) [facility] Rapid) [[id number]] Collected: 08/03/19 0238    Specimen: Nasopharyngeal Swab from Nasopharynx Updated: 08/03/19 0314     Purpose of COVID testing Screening     SARS-CoV-2 Specimen Source Nasopharyngeal     SARS CoV 2 Overall Result Negative     Comment: Test performed using the Abbott ID NOW EUA assay.  Please see Fact Sheets for patients and providers located at:  http://www.rice.biz/  This test is for the qualitative detection of SARS-CoV-2  (COVID19) nucleic acid. Viral nucleic acids may persist in vivo,  independent of viability. Detection of viral nucleic acid does  not imply the presence of infectious virus, or that virus  nucleic acid is the cause of clinical symptoms. Negative  results should be treated as presumptive and, if inconsistent  with clinical signs and symptoms or necessary for patient  management, should be tested with an alternative molecular  assay. Negative results do not preclude SARS-CoV-2 infection  and should not be used as the sole basis for patient  management decisions. Invalid results may be due to inhibiting  substances in the specimen and recollection should occur.         Narrative:      o Collect and clearly label specimen type:  o Upper respiratory specimen: One Nasopharyngeal Dry Swab NO  Transport Media.  o Hand deliver to laboratory ASAP  Indication for testing->Extended care facility admission to  semi private room  Rescheduled by [id number] at 08/03/2019 02:35 Reason: Printed by   mistake/Printing Issues.           RADIOLOGY     Upon my review:    Romero Belling  7:46 AM 08/04/2019

## 2019-08-04 NOTE — UM Notes (Signed)
[facility] Utilization Review   NPI #[id number], Tax ID [id number]  Please call Fuller Plan MSN RN CCM @ [phone] with any questions or concerns.  Email:  [email or web address]  Fax final authorization and requests for additional information to [phone]        DATE/TIME OF ADMISSION ORDER:08/04/19 1019 (CHANGED TO INPATIENT)  CONCURRENT REVIEW FOR: 08/04/19    LOC: IMCU    PATIENT NAME: [name],[name] / [birth year] / AGE: 29 y.o.      S/I: CHEST TUBE, CHEST PAIN; WORSENING OF PNEUMOTHORAX        V/S:    Vitals:    08/04/19 0734   BP: 111/72   Pulse: 67   Resp: 17   Temp: 98.1 F (36.7 C)   SpO2: 100%          Temperature: Temp  Min: 97.2 F (36.2 C)  Max: 98.2 F (36.8 C)  Pulse: Pulse  Min: 56  Max: 73  Respiratory: Resp  Min: 12  Max: 20  Non-Invasive BP: BP  Min: 103/65  Max: 111/72  Pulse Oximetry SpO2  Min: 98 %  Max: 100 %    PAIN MAX 5/10    Labs -  Lab 08/03/19  0427 08/02/19  2142   CO2 20* 23   Creatinine 0.9 1.2*   Glucose 113* 101*             Radiologic Studies -   Xr Chest Ap Portable    Result Date: 08/03/2019   The cardiomediastinal silhouette has not shown significant interval change allowing for differences in patient positioning. Right pleural catheter does not appear significantly changed. There has been interval decrease in right pneumothorax with small residual right apical pneumothorax. Nodular density projecting over the left upper lobe may be artifactual. The lungs otherwise appear clear. There is no evidence for pleural effusion. Sandie Ano, MD  08/03/2019 7:05 PM    Xr Chest Ap Portable    Result Date: 08/03/2019   The cardiomediastinal silhouette has not shown significant interval change allowing for differences in patient positioning. Right pleural catheter does not appear significantly changed. Previously identified small right pneumothorax has shown mild interval increase in size. Linear opacity projecting over the right lung base does not  appear significantly changed. The left lung appears clear. There is no evidence for pneumothorax on the left. 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)  she is status post chest tube placement  Analgesics for pain   a noncontrast CT scan of the chest was done on previous admission which was negative for structural process  A D-dimer was normal at that time as well   Alpha 1 antitrypsin level was also normal  Urine rapid drug screen negative    Patient had episode of chest pressure on previous day  Follow-up x-ray revealed worsening of pneumothorax after chest tube was placed on waterseal  Patient back on low intermittent suction with follow-up x-ray revealed improvement in pneumothorax  No chest pressure this morning    Acute kidney injury  Most likely due to volume depletion  Resolved with IV fluid hydration    Obesity  Diet exercise weight loss and lifestyle modification    Hypoglycemia  Hemoglobin A1c    Rule out UTI  Check urine culture  Empiric antibiotics in the interim.  We will discuss further management with pulmonary medicine      Analgesia: Morphine,  oxycodone and Tylenol    Nutrition: Regular diet    DVT Prophylaxis: SCDs      Current Medications:   Scheduled Meds:  Current Facility-Administered Medications   Medication Dose Route Frequency    cefTRIAXone  1 g Intravenous Q24H     Continuous Infusions:   sodium chloride 75 mL/hr at 08/03/19 0928     PRN Meds:.acetaminophen **OR** acetaminophen, midazolam, morphine, naloxone, ondansetron **OR** ondansetron, oxyCODONE       TYLENOL 650 MG PO--X2  OXYCODONE 5 MG PO--X2

## 2019-08-04 NOTE — Progress Notes (Signed)
Situation 30 day readmission  Pneumothorax, chest tube to suction placed     Background From home  Reports concerns re: reoccurrence of pneumothorax, requested to speak to MD. CM sent message to MD who acknowledged pt's request  Pt reports having 77 month old baby at home and is pumping currently, reports having necessary supplies to pump while in hospital     Assessment See below  Pt from home, lives with grandmother and provides care for infant  Ind w/ ADLs at baseline.  Verified demographics, insurance. Has Rx coverage through [facility] plan, denies issues paying copay  Pt will contact CM back when determining PCPs name (sees someone different than on insurance card per pt)     Recommendation DCP home, no needs currently  Grandmother will transport            08/04/19 1431   Patient Type   Within 30 Days of Previous Admission? Yes   Healthcare Decisions   Interviewed: Patient   Orientation/Decision Making Abilities of Patient Alert and Oriented x3, able to make decisions   Advance Directive Patient does not have advance directive   Healthcare Agent Appointed No   Prior to admission   Prior level of function Independent with ADLs   Type of Residence Private residence   Have running water, electricity, heat, etc? Yes   Living Arrangements Family members   How do you get to your MD appointments? ind   How do you get your groceries? ind   Who fixes your meals? ind   Who does your laundry? ind   Who picks up your prescriptions? ind   Dressing Independent   Grooming Independent   Feeding Independent   Bathing Independent   Toileting Independent   DME Currently at Home Other (Comment)  (none)   Name of Prior Assisted Living Facility none   Home Care/Community Services None   Prior SNF admission? (Detail) none   Prior Rehab admission? (Detail) none   Adult Protective Services (APS) involved? No   Discharge Planning   Support Systems Family members   Patient expects to be discharged to: home, no needs likely at [facility]    Anticipated [place] plan discussed with: Same as interviewed   Mode of transportation: Private car (family member)   Does the patient have perscription coverage? Yes   Consults/Providers   PT Evaluation Needed 2   OT Evalulation Needed 2   SLP Evaluation Needed 2   Outcome Palliative Care Screen Screened but did not meet criteria for intervention   Correct PCP listed in Epic? No (comment)  (Pt will verify PCP name and contact CM to add to chart)   Family and PCP   In case you are admitted, would like family notified? Yes  (family aware per pt)   In case you are admitted, would like your PCP notified? No   Important Message from Medicare Notice   Patient received 1st IMM Letter? n/a     Madie Reno, RN  Manager, Case Management  [phone]

## 2019-08-04 NOTE — Plan of Care (Signed)
Problem: Safety  Goal: Patient will be free from injury during hospitalization  Outcome: Progressing  Flowsheets (Taken 08/04/2019 1424)  Patient will be free from injury during hospitalization:   Assess patient's risk for falls and implement fall prevention plan of care per policy   Provide and maintain safe environment   Ensure appropriate safety devices are available at the bedside   Use appropriate transfer methods   Include patient/ family/ care giver in decisions related to safety   Hourly rounding     Problem: Inadequate Gas Exchange  Goal: Adequate oxygenation and improved ventilation  Outcome: Progressing  Flowsheets (Taken 08/04/2019 1424)  Adequate oxygenation and improved ventilation:   Assess lung sounds   Monitor SpO2 and treat as needed   Provide mechanical and oxygen support to facilitate gas exchange   Position for maximum ventilatory efficiency   Teach/reinforce use of incentive spirometer 10 times per hour while awake, cough and deep breath as needed   Increase activity as tolerated/progressive mobility   Plan activities to conserve energy: plan rest periods   Consult/collaborate with Respiratory Therapy     Pt aox4. VSS on 2L NC. Complained of pain in right shoulder, relieved with prn meds per orders.   Chest tube monitored and assessed throughout shift. Hourly rounding performed.   Will continue plan of care.

## 2019-08-05 ENCOUNTER — Inpatient Hospital Stay: Payer: Medicaid Other

## 2019-08-05 LAB — GFR: EGFR: >60

## 2019-08-05 LAB — CBC AND DIFFERENTIAL
Absolute NRBC: 0 10*3/uL (ref 0.00–0.00)
Basophils Absolute Automated: 0.04 10*3/uL (ref 0.00–0.08)
Basophils Automated: 0.6 %
Eosinophils Absolute Automated: 0.21 10*3/uL (ref 0.00–0.44)
Eosinophils Automated: 3 %
Hematocrit: 38.3 % (ref 34.7–43.7)
Hgb: 12.4 g/dL (ref 11.4–14.8)
Immature Granulocytes Absolute: 0.01 10*3/uL (ref 0.00–0.07)
Immature Granulocytes: 0.1 %
Lymphocytes Absolute Automated: 2.66 10*3/uL (ref 0.42–3.22)
Lymphocytes Automated: 38.6 %
MCH: 30.2 pg (ref 25.1–33.5)
MCHC: 32.4 g/dL (ref 31.5–35.8)
MCV: 93.2 fL (ref 78.0–96.0)
MPV: 9.4 fL (ref 8.9–12.5)
Monocytes Absolute Automated: 0.5 10*3/uL (ref 0.21–0.85)
Monocytes: 7.3 %
Neutrophils Absolute: 3.47 10*3/uL (ref 1.10–6.33)
Neutrophils: 50.4 %
Nucleated RBC: 0 /100 WBC (ref 0.0–0.0)
Platelets: 303 10*3/uL (ref 142–346)
RBC: 4.11 10*6/uL (ref 3.90–5.10)
RDW: 12 % (ref 11–15)
WBC: 6.89 10*3/uL (ref 3.10–9.50)

## 2019-08-05 LAB — BASIC METABOLIC PANEL
Anion Gap: 8 (ref 5.0–15.0)
BUN: 10 mg/dL (ref 7.0–19.0)
CO2: 25 mEq/L (ref 22–29)
Calcium: 9 mg/dL (ref 8.5–10.5)
Chloride: 106 mEq/L (ref 100–111)
Creatinine: 0.8 mg/dL (ref 0.6–1.0)
Glucose: 84 mg/dL (ref 70–100)
Potassium: 4.1 mEq/L (ref 3.5–5.1)
Sodium: 139 mEq/L (ref 136–145)

## 2019-08-05 LAB — HEMOLYSIS INDEX: Hemolysis Index: 2 (ref 0–18)

## 2019-08-05 MED ORDER — SENNOSIDES-DOCUSATE SODIUM 8.6-50 MG PO TABS
1.0000 | ORAL_TABLET | Freq: Every evening | ORAL | Status: DC
Start: 2019-08-05 — End: 2019-08-10
  Administered 2019-08-05 – 2019-08-09 (×5): 1 via ORAL
  Filled 2019-08-05 (×5): qty 1

## 2019-08-05 MED ORDER — MORPHINE SULFATE 4 MG/ML IJ/IV SOLN (WRAP)
4.0000 mg | Status: AC | PRN
Start: 2019-08-05 — End: 2019-08-07
  Administered 2019-08-05: 4 mg via INTRAVENOUS
  Filled 2019-08-05: qty 1

## 2019-08-05 NOTE — Progress Notes (Signed)
[facility]  PROGRESS NOTE      Patient: [name]  Date: 08/05/2019   LOS: 1 Days  Admission Date: 08/03/2019   MRN: [id number]  Attending: Gwyndolyn Kaufman  Please contact me on the following Spectralink [phone]       ASSESSMENT/PLAN     Chani Karnatz is a 29 y.o. 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 Gean Quint will consider pleurodesis if she develops third episode of pneumothorax      Acute kidney injury  Most likely due to volume depletion  Resolved with IV fluid hydration    Obesity  Diet exercise weight loss and lifestyle modification    Hypoglycemia  Hemoglobin A1c    Rule out UTI  Check urine culture  Empiric antibiotics in the interim      Discussed plan with patient    Discussed with patient and bedside RN    Analgesia: Morphine,  oxycodone and Tylenol    Nutrition: Regular diet    DVT Prophylaxis: SCDs       Code Status: Full code    DISPO: To be determined    Family Contact: Patient's grandmother       SUBJECTIVE     [name] states she has no dyspnea   complains of pain at the site of a chest tube otherwise no new complaints    MEDICATIONS     Current Facility-Administered Medications   Medication Dose Route Frequency    cefTRIAXone  1 g Intravenous Q24H    senna-docusate  1 tablet Oral QHS       PHYSICAL EXAM     Vitals:    08/05/19 1521   BP: 105/69   Pulse: 80   Resp: 15   Temp: 97.5 F (36.4 C)   SpO2: 97%       Temperature: Temp  Min: 97.4 F (36.3 C)  Max: 98 F (36.7 C)  Pulse: Pulse  Min: 56  Max:  80  Respiratory: Resp  Min: 15  Max: 18  Non-Invasive BP: BP  Min: 105/69  Max: 122/84  Pulse Oximetry SpO2  Min: 95 %  Max: 100 %    Intake and Output Summary (Last 24 hours) at Date Time    Intake/Output Summary (Last 24 hours) at 08/05/2019 1806  Last data filed at 08/05/2019 0524  Gross per 24 hour   Intake 960 ml   Output 0 ml   Net 960 ml       GEN APPEARANCE: Obese female in no acute distress A&OX3  HEENT: PERLA; EOMI; Conjunctiva Clear  NECK: Supple; No bruits  CVS: RRR, S1, S2; No M/G/R  LUNGS: CTAB; No Wheezes; No Rhonchi: No rales  ABD: Soft; No TTP; + Normoactive BS  EXT: No edema; Pulses 2+ and intact  Skin exam: Warm and dry  NEURO: CN 2-12 intact; No Focal neurological deficits  CAP REFILL: Less than 3 seconds capillary refill  MENTAL STATUS: Awake alert and oriented x3  LABS     Recent Labs   Lab 08/05/19  0331 08/03/19  0427 08/02/19  2142   WBC 6.89 7.77 6.41   RBC 4.11 4.18 4.24   Hgb 12.4 12.5 13.0   Hematocrit 38.3 38.6 38.8   MCV 93.2 92.3 91.5   Platelets 303 334 305       Recent Labs   Lab 08/05/19  0331 08/04/19  0957 08/03/19  0427 08/02/19  2142   Sodium 139 139 140 142   Potassium 4.1 3.8 4.7 4.4   Chloride 106 108 109 107   CO2 25 23 20* 23   BUN 10.0 12.0 16.0 14   Creatinine 0.8 0.8 0.9 1.2*   Glucose 84 127* 113* 101*   Calcium 9.0 8.8 9.1 9.4       Recent Labs   Lab 08/02/19  2142   ALT 21   AST (SGOT) 16   Bilirubin, Total 0.2   Albumin 4.3   Alkaline Phosphatase 100       Recent Labs   Lab 08/02/19  2142   Troponin I <0.01       Recent Labs   Lab 08/03/19  0427   PT INR 0.9   PT 12.4*       Microbiology Results     Procedure Component Value Units Date/Time    Adult Urine culture [[id number]] Collected: 08/03/19 1341    Specimen: Urine, Clean Catch Updated: 08/03/19 1341    Narrative:      R/O UTI  ABNORMAL U/A  Replace urinary catheter prior to obtaining the urine culture  if it has been in place for greater than or equal to 14  days:->N/A No Foley  Indications for Urine  Culture:->Other (please specify in  Comments)    COVID-19 (SARS-COV-2) [facility] Rapid) [[id number]] Collected: 08/03/19 0238    Specimen: Nasopharyngeal Swab from Nasopharynx Updated: 08/03/19 0314     Purpose of COVID testing Screening     SARS-CoV-2 Specimen Source Nasopharyngeal     SARS CoV 2 Overall Result Negative     Comment: Test performed using the Abbott ID NOW EUA assay.  Please see Fact Sheets for patients and providers located at:  http://www.rice.biz/  This test is for the qualitative detection of SARS-CoV-2  (COVID19) nucleic acid. Viral nucleic acids may persist in vivo,  independent of viability. Detection of viral nucleic acid does  not imply the presence of infectious virus, or that virus  nucleic acid is the cause of clinical symptoms. Negative  results should be treated as presumptive and, if inconsistent  with clinical signs and symptoms or necessary for patient  management, should be tested with an alternative molecular  assay. Negative results do not preclude SARS-CoV-2 infection  and should not be used as the sole basis for patient  management decisions. Invalid results may be due to inhibiting  substances in the specimen and recollection should occur.         Narrative:      o Collect and clearly label specimen type:  o Upper respiratory specimen: One Nasopharyngeal Dry Swab NO  Transport Media.  o Hand deliver to laboratory ASAP  Indication for testing->Extended care facility admission to  semi private room  Rescheduled by [id number] at 08/03/2019 02:35 Reason: Printed by   mistake/Printing Issues.           RADIOLOGY     Upon my review:    Romero Belling  6:06 PM 08/05/2019

## 2019-08-05 NOTE — Plan of Care (Signed)
Problem: Pain  Goal: Pain at adequate level as identified by patient  Outcome: Progressing  Flowsheets (Taken 08/03/2019 1638 by Karl Ito, RN)  Pain at adequate level as identified by patient:   Reassess pain within 30-60 minutes of any procedure/intervention, per Pain Assessment, Intervention, Reassessment (AIR) Cycle   Assess pain on admission, during daily assessment and/or before any "as needed" intervention(s)   Offer non-pharmacological pain management interventions     Problem: Inadequate Gas Exchange  Goal: Adequate oxygenation and improved ventilation  Outcome: Progressing  Flowsheets (Taken 08/04/2019 1424 by Latanya Presser, RN)  Adequate oxygenation and improved ventilation:   Assess lung sounds   Monitor SpO2 and treat as needed   Provide mechanical and oxygen support to facilitate gas exchange   Position for maximum ventilatory efficiency   Teach/reinforce use of incentive spirometer 10 times per hour while awake, cough and deep breath as needed   Increase activity as tolerated/progressive mobility   Plan activities to conserve energy: plan rest periods   Consult/collaborate with Respiratory Therapy     Chest tube remains to wall suction, no output noted. Pt on 2L of oxygen. Pain controlled with oxycodone and tylenol. Continue POC.

## 2019-08-05 NOTE — Plan of Care (Addendum)
Problem: Safety  Goal: Patient will be free from injury during hospitalization  Outcome: Progressing  Flowsheets (Taken 08/05/2019 1745)  Patient will be free from injury during hospitalization:   Assess patient's risk for falls and implement fall prevention plan of care per policy   Provide and maintain safe environment   Ensure appropriate safety devices are available at the bedside   Use appropriate transfer methods   Include patient/ family/ care giver in decisions related to safety   Hourly rounding     Problem: Pain  Goal: Pain at adequate level as identified by patient  Outcome: Progressing  Flowsheets (Taken 08/05/2019 1745)  Pain at adequate level as identified by patient:   Identify patient comfort function goal   Assess for risk of opioid induced respiratory depression, including snoring/sleep apnea. Alert healthcare team of risk factors identified.   Assess pain on admission, during daily assessment and/or before any "as needed" intervention(s)   Reassess pain within 30-60 minutes of any procedure/intervention, per Pain Assessment, Intervention, Reassessment (AIR) Cycle   Evaluate if patient comfort function goal is met   Evaluate patient's satisfaction with pain management progress   Offer non-pharmacological pain management interventions   Include patient/patient care companion in decisions related to pain management as needed     Problem: Side Effects from Pain Analgesia  Goal: Patient will experience minimal side effects of analgesic therapy  Outcome: Progressing  Flowsheets (Taken 08/05/2019 1745)  Patient will experience minimal side effects of analgesic therapy:   Monitor/assess patient's respiratory status (RR depth, effort, breath sounds)   Assess for changes in cognitive function   Prevent/manage side effects per LIP orders (i.e. nausea, vomiting, pruritus, constipation, urinary retention, etc.)   Evaluate for opioid-induced sedation with appropriate assessment tool (i.e.  POSS)     Problem: Psychosocial and Spiritual Needs  Goal: Demonstrates ability to cope with hospitalization/illness  Outcome: Progressing  Flowsheets (Taken 08/05/2019 1745)  Demonstrates ability to cope with hospitalizations/illness:   Encourage patient to set small goals for self   Provide quiet environment   Encourage verbalization of feelings/concerns/expectations   Encourage participation in diversional activity   Include patient/ patient care companion in decisions     Problem: Compromised Tissue integrity  Goal: Nutritional status is improving  Outcome: Progressing  Flowsheets (Taken 08/05/2019 1745)  Nutritional status is improving:   Allow adequate time for meals   Include patient/patient care companion in decisions related to nutrition     Problem: Compromised Hemodynamic Status  Goal: Vital signs and fluid balance maintained/improved  Outcome: Progressing  Flowsheets (Taken 08/05/2019 1745)  Vital signs and fluid balance are maintained/improved:   Position patient for maximum circulation/cardiac output   Monitor/assess vitals and hemodynamic parameters with position changes   Monitor and compare daily weight     Problem: Inadequate Gas Exchange  Goal: Adequate oxygenation and improved ventilation  Outcome: Progressing  Flowsheets (Taken 08/05/2019 1745)  Adequate oxygenation and improved ventilation:   Assess lung sounds   Monitor SpO2 and treat as needed   Provide mechanical and oxygen support to facilitate gas exchange   Position for maximum ventilatory efficiency   Teach/reinforce use of incentive spirometer 10 times per hour while awake, cough and deep breath as needed   Plan activities to conserve energy: plan rest periods   Increase activity as tolerated/progressive mobility   Consult/collaborate with Respiratory Therapy     Problem: Impaired Mobility  Goal: Mobility/Activity is maintained at optimal level for patient  Outcome: Progressing  Flowsheets (Taken  08/05/2019  1745)  Mobility/activity is maintained at optimal level for patient:   Increase mobility as tolerated/progressive mobility   Encourage independent activity per ability   Maintain proper body alignment   Perform active/passive ROM   Plan activities to conserve energy, plan rest periods   Reposition patient every 2 hours and as needed unless able to reposition self   Assess for changes in respiratory status, level of consciousness and/or development of fatigue     Pt received resting comfortably in bed, AAOx4, VSS, NSR on monitor. No c/o chest pain or dyspnea. Pt c/o pain in shoulder and back, MD aware and pt medicated throughout shift. Pt OOB to chair for approx 3hrs during shift. Chest tube clamped a1430hrs by pulmonologist. Pt advised to alert RN of any new chest pain or shortness of breath; pt verbalizes understanding. Per attending MD, ok to remove old sutures in R chest. Pt refused at this time d/t sleeping and eating, will pass to night RN.

## 2019-08-05 NOTE — Progress Notes (Signed)
PULMONARY PROGRESS NOTE                                                                                                              [phone]    Date Time: 08/05/19 7:39 PM  Patient Name: [name],[name] 29 y.o. female admitted with Recurrent spontaneous pneumothorax  Admit Date: 08/03/2019    Patient status: Inpatient  Hospital Day: 1           Assessment:   Recurrent pneumothorax this is second episode  Status post thoracic vent placement  No air leak at this time    Plan:       Clamp the chest tube  Follow chest x-ray  Follow chest x-ray in the morning  If no evidence of pneumothorax we will pull the tube out and patient can be discharged  If patient continued to have recurrent pneumothorax in the future may benefit from pleurodesis  Subjective:       No new complaints            Medications:     Current Facility-Administered Medications   Medication Dose Route Frequency    cefTRIAXone  1 g Intravenous Q24H    senna-docusate  1 tablet Oral QHS       Review of Systems:        General ROS:  Afebrile    ENT ROS:  No sore throat no nasal discharge   Endocrine ROS: fatigue   Respiratory ROS: No shortness of breath   Cardiovascular ROS:  No chest pain or palpitation   Gastrointestinal ROS:  No nausea vomiting diarrhea.  No melanotic stool   Genito-Urinary ROS:  No burning in the urine or hematuria   Musculoskeletal ROS:  No musculoskeletal deformities   Neurological ROS:  No stroke seizure disorder   Dermatological ROS:  No skin rash        Physical Exam:     Vitals:    08/05/19 1521   BP: 105/69   Pulse: 80   Resp: 15   Temp: 97.5 F (36.4 C)   SpO2: 97%         Intake/Output Summary (Last 24 hours) at 08/05/2019 1939  Last data filed at 08/05/2019 0981  Gross per 24 hour   Intake 960 ml   Output 0 ml   Net 960 ml           General appearance - no visible respiratory distress patient does not appear toxic  Mental status -  Alert and oriented x3  Eyes - EOMI PERRLA  Nose - no nasal discharge  Mouth  - mucous membrane is moist.    Neck - no JVD lymphadenopathy or thyromegaly  Chest - clear to auscultation, thoracic vent in right upper chest  Heart - S1-S2 RRR no S3-S4 no murmur  Abdomen - soft nontender bowel sounds are normal with no hepatosplenomegaly  Neurological - no motor or sensory deficit  Extremities - no edema clubbing or cyanosis  Skin - no skin rash  Labs:     CBC w/Diff CMP   Recent Labs   Lab 08/05/19  0331 08/03/19  0427 08/02/19  2142   WBC 6.89 7.77 6.41   Hgb 12.4 12.5 13.0   Hematocrit 38.3 38.6 38.8   Platelets 303 334 305   MCV 93.2 92.3 91.5   Neutrophils 50.4 73.1 53.3       PT/INR   Recent Labs   Lab 08/03/19  0427   PT INR 0.9       Recent Labs   Lab 08/05/19  0331 08/04/19  0957 08/03/19  0427 08/02/19  2142   Sodium 139 139 140 142   Potassium 4.1 3.8 4.7 4.4   Chloride 106 108 109 107   CO2 25 23 20* 23   BUN 10.0 12.0 16.0 14   Creatinine 0.8 0.8 0.9 1.2*   Glucose 84 127* 113* 101*   Calcium 9.0 8.8 9.1 9.4   Protein, Total  --   --   --  7.5   Albumin  --   --   --  4.3   AST (SGOT)  --   --   --  16   ALT  --   --   --  21   Alkaline Phosphatase  --   --   --  100   Bilirubin, Total  --   --   --  0.2      Glucose POCT   Recent Labs   Lab 08/05/19  0331 08/04/19  0957 08/03/19  0427 08/02/19  2142   Glucose 84 127* 113* 101*        Recent Labs   Lab 08/02/19  2142   Troponin I <0.01         ABGs:  No results found for: Paul Dykes, PHART, PCO2ART, PO2ART, HCO3ART, BEART, O2SATART    Urinalysis  Recent Labs   Lab 08/02/19  2142   Urine Type Urine, Clean Ca   Color, UA Yellow   Clarity, UA Sl Cloudy*   Specific Gravity UA 1.025   Urine pH 7.0   Nitrite, UA Negative   Ketones UA Negative   Urobilinogen, UA Negative   Bilirubin, UA Negative   Blood, UA Negative   WBC, UA 11 - 25*         Rads:   Xr Chest Ap Portable    Result Date: 08/05/2019   Previously seen right apical pneumothorax is not currently well seen by plain film [name], MD  08/05/2019 11:36  AM        Gean Quint MD  08/05/2019  7:39 PM

## 2019-08-05 NOTE — Plan of Care (Addendum)
Problem: Safety  Goal: Patient will be free from injury during hospitalization  Outcome: Progressing  Flowsheets (Taken 08/05/2019 2051)  Patient will be free from injury during hospitalization:   Assess patient's risk for falls and implement fall prevention plan of care per policy   Provide and maintain safe environment   Use appropriate transfer methods   Ensure appropriate safety devices are available at the bedside   Hourly rounding     Problem: Pain  Goal: Pain at adequate level as identified by patient  Outcome: Progressing  Flowsheets (Taken 08/05/2019 2051)  Pain at adequate level as identified by patient:   Identify patient comfort function goal   Assess for risk of opioid induced respiratory depression, including snoring/sleep apnea. Alert healthcare team of risk factors identified.   Assess pain on admission, during daily assessment and/or before any "as needed" intervention(s)   Reassess pain within 30-60 minutes of any procedure/intervention, per Pain Assessment, Intervention, Reassessment (AIR) Cycle   Evaluate if patient comfort function goal is met   Evaluate patient's satisfaction with pain management progress   Offer non-pharmacological pain management interventions     Problem: Inadequate Gas Exchange  Goal: Adequate oxygenation and improved ventilation  Outcome: Progressing  Flowsheets (Taken 08/05/2019 2051)  Adequate oxygenation and improved ventilation:   Assess lung sounds   Monitor SpO2 and treat as needed   Provide mechanical and oxygen support to facilitate gas exchange   Teach/reinforce use of incentive spirometer 10 times per hour while awake, cough and deep breath as needed   Plan activities to conserve energy: plan rest periods   Increase activity as tolerated/progressive mobility   Patient resting in bed. Chest tube is clamped per Pulmonary MD. Will continue to monitor. Patient medicated for generalized aching 2/10. Patient up with nursing to bedside commode able to  void and have BM. Patient on 2L nc, tolerating well. Will continue to monitor patient. 2215 Writer removed two sutures from previous chest tube site on right lateral chest. Patient tolerated well, no noted drainage, and incision dry/intact. 1610 Patient labs, vitals, and denies any discomfort at this time, chest tube clamped per md.

## 2019-08-06 ENCOUNTER — Inpatient Hospital Stay: Payer: Medicaid Other

## 2019-08-06 LAB — CBC AND DIFFERENTIAL
Absolute NRBC: 0 10*3/uL (ref 0.00–0.00)
Basophils Absolute Automated: 0.02 10*3/uL (ref 0.00–0.08)
Basophils Automated: 0.3 %
Eosinophils Absolute Automated: 0.19 10*3/uL (ref 0.00–0.44)
Eosinophils Automated: 3.2 %
Hematocrit: 37.8 % (ref 34.7–43.7)
Hgb: 12.5 g/dL (ref 11.4–14.8)
Immature Granulocytes Absolute: 0.01 10*3/uL (ref 0.00–0.07)
Immature Granulocytes: 0.2 %
Lymphocytes Absolute Automated: 2.06 10*3/uL (ref 0.42–3.22)
Lymphocytes Automated: 34.8 %
MCH: 30.6 pg (ref 25.1–33.5)
MCHC: 33.1 g/dL (ref 31.5–35.8)
MCV: 92.4 fL (ref 78.0–96.0)
MPV: 9 fL (ref 8.9–12.5)
Monocytes Absolute Automated: 0.44 10*3/uL (ref 0.21–0.85)
Monocytes: 7.4 %
Neutrophils Absolute: 3.2 10*3/uL (ref 1.10–6.33)
Neutrophils: 54.1 %
Nucleated RBC: 0 /100 WBC (ref 0.0–0.0)
Platelets: 274 10*3/uL (ref 142–346)
RBC: 4.09 10*6/uL (ref 3.90–5.10)
RDW: 12 % (ref 11–15)
WBC: 5.92 10*3/uL (ref 3.10–9.50)

## 2019-08-06 LAB — HEMOLYSIS INDEX: Hemolysis Index: 15 (ref 0–18)

## 2019-08-06 LAB — BASIC METABOLIC PANEL
Anion Gap: 9 (ref 5.0–15.0)
BUN: 9 mg/dL (ref 7.0–19.0)
CO2: 26 mEq/L (ref 22–29)
Calcium: 9.3 mg/dL (ref 8.5–10.5)
Chloride: 106 mEq/L (ref 100–111)
Creatinine: 0.9 mg/dL (ref 0.6–1.0)
Glucose: 95 mg/dL (ref 70–100)
Potassium: 4.4 mEq/L (ref 3.5–5.1)
Sodium: 141 mEq/L (ref 136–145)

## 2019-08-06 LAB — GFR: EGFR: >60

## 2019-08-06 LAB — ANCA PANEL FOR VASCULITIS, S
Myeloperoxidase AB: <0.2 U
Proteinase 3 Antibody: <0.2 U

## 2019-08-06 NOTE — Progress Notes (Signed)
[facility]  PROGRESS NOTE      Patient: [name]  Date: 08/06/2019   LOS: 2 Days  Admission Date: 08/03/2019   MRN: [id number]  Attending: Gwyndolyn Kaufman  Please contact me on the following Spectralink [phone]       ASSESSMENT/PLAN     Mikeria Valin is a 29 y.o. 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 Gean Quint will consider pleurodesis if she develops third episode of pneumothorax      Acute kidney injury  Most likely due to volume depletion  Resolved with IV fluid hydration    Obesity  Diet exercise weight loss and lifestyle modification    Hypoglycemia  Hemoglobin A1c    Rule out UTI  Urine cultures are sterile  Discontinue ceftriaxone      Discussed plan with patient    Discussed with patient and bedside RN    Analgesia: Morphine,  oxycodone and Tylenol    Nutrition: Regular diet    DVT Prophylaxis: SCDs       Code Status: Full code    DISPO: To be determined    Family Contact: Patient's grandmother       SUBJECTIVE     [name] states she has no dyspnea   complains of pain at the site of a chest tube otherwise no new complaints    MEDICATIONS     Current Facility-Administered Medications   Medication Dose Route Frequency    cefTRIAXone  1 g Intravenous Q24H    senna-docusate  1 tablet Oral QHS       PHYSICAL EXAM     Vitals:    08/06/19 1650   BP: 115/69   Pulse: 68   Resp: 17   Temp: 97.2 F (36.2 C)   SpO2: 100%       Temperature: Temp  Min: 97.2 F (36.2 C)  Max: 98.3 F (36.8 C)  Pulse: Pulse  Min: 65  Max:  95  Respiratory: Resp  Min: 14  Max: 19  Non-Invasive BP: BP  Min: 110/79  Max: 119/66  Pulse Oximetry SpO2  Min: 95 %  Max: 100 %    Intake and Output Summary (Last 24 hours) at Date Time    Intake/Output Summary (Last 24 hours) at 08/06/2019 1727  Last data filed at 08/05/2019 2237  Gross per 24 hour   Intake 500 ml   Output 200 ml   Net 300 ml       GEN APPEARANCE: Obese female in no acute distress A&OX3  HEENT: PERLA; EOMI; Conjunctiva Clear  NECK: Supple; No bruits  CVS: RRR, S1, S2; No M/G/R  LUNGS: CTAB; No Wheezes; No Rhonchi: No rales  ABD: Soft; No TTP; + Normoactive BS  EXT: No edema; Pulses 2+ and intact  Skin exam: Warm and dry  NEURO: CN 2-12 intact; No Focal neurological deficits  CAP REFILL: Less than 3 seconds capillary refill  MENTAL STATUS: Awake alert and oriented x3  LABS     Recent Labs   Lab 08/06/19  0549 08/05/19  0331 08/03/19  0427   WBC 5.92 6.89 7.77   RBC 4.09 4.11 4.18   Hgb 12.5 12.4 12.5   Hematocrit 37.8 38.3 38.6   MCV 92.4 93.2 92.3   Platelets 274 303 334       Recent Labs   Lab 08/06/19  0549 08/05/19  0331 08/04/19  0957 08/03/19  0427 08/02/19  2142   Sodium 141 139 139 140 142   Potassium 4.4 4.1 3.8 4.7 4.4   Chloride 106 106 108 109 107   CO2 26 25 23  20* 23   BUN 9.0 10.0 12.0 16.0 14   Creatinine 0.9 0.8 0.8 0.9 1.2*   Glucose 95 84 127* 113* 101*   Calcium 9.3 9.0 8.8 9.1 9.4       Recent Labs   Lab 08/02/19  2142   ALT 21   AST (SGOT) 16   Bilirubin, Total 0.2   Albumin 4.3   Alkaline Phosphatase 100       Recent Labs   Lab 08/02/19  2142   Troponin I <0.01       Recent Labs   Lab 08/03/19  0427   PT INR 0.9   PT 12.4*       Microbiology Results     Procedure Component Value Units Date/Time    Adult Urine culture [[id number]] Collected: 08/03/19 1341    Specimen: Urine, Clean Catch Updated: 08/03/19 1341    Narrative:      R/O UTI  ABNORMAL U/A  Replace urinary catheter prior to obtaining the urine culture  if it has been in place for greater than or equal to  14  days:->N/A No Foley  Indications for Urine Culture:->Other (please specify in  Comments)    COVID-19 (SARS-COV-2) [facility] Rapid) [[id number]] Collected: 08/03/19 0238    Specimen: Nasopharyngeal Swab from Nasopharynx Updated: 08/03/19 0314     Purpose of COVID testing Screening     SARS-CoV-2 Specimen Source Nasopharyngeal     SARS CoV 2 Overall Result Negative     Comment: Test performed using the Abbott ID NOW EUA assay.  Please see Fact Sheets for patients and providers located at:  http://www.rice.biz/  This test is for the qualitative detection of SARS-CoV-2  (COVID19) nucleic acid. Viral nucleic acids may persist in vivo,  independent of viability. Detection of viral nucleic acid does  not imply the presence of infectious virus, or that virus  nucleic acid is the cause of clinical symptoms. Negative  results should be treated as presumptive and, if inconsistent  with clinical signs and symptoms or necessary for patient  management, should be tested with an alternative molecular  assay. Negative results do not preclude SARS-CoV-2 infection  and should not be used as the sole basis for patient  management decisions. Invalid results may be due to inhibiting  substances in the specimen and recollection should occur.         Narrative:      o Collect and clearly label specimen type:  o Upper respiratory specimen: One Nasopharyngeal Dry Swab NO  Transport Media.  o Hand deliver to laboratory ASAP  Indication for testing->Extended care facility admission to  semi private room  Rescheduled by [id number] at 08/03/2019 02:35 Reason: Printed by   mistake/Printing Issues.           RADIOLOGY     Upon my review:    Signed,  Myra Gianotti Lacreshia Bondarenko  5:27 PM 08/06/2019

## 2019-08-06 NOTE — Progress Notes (Signed)
Chest tube in place    DCP home, no CM needs. Grandmother to transport    Madie Reno, Scientific laboratory technician, Case Management  [phone]

## 2019-08-06 NOTE — Plan of Care (Addendum)
Problem: Safety  Goal: Patient will be free from injury during hospitalization  Outcome: Progressing  Flowsheets (Taken 08/06/2019 1248)  Patient will be free from injury during hospitalization:   Assess patient's risk for falls and implement fall prevention plan of care per policy   Provide and maintain safe environment   Ensure appropriate safety devices are available at the bedside   Use appropriate transfer methods   Include patient/ family/ care giver in decisions related to safety   Hourly rounding  Goal: Patient will be free from infection during hospitalization  Outcome: Progressing  Flowsheets (Taken 08/06/2019 1248)  Free from Infection during hospitalization:   Assess and monitor for signs and symptoms of infection   Monitor all insertion sites (i.e. indwelling lines, tubes, urinary catheters, and drains)   Encourage patient and family to use good hand hygiene technique   Monitor lab/diagnostic results     Problem: Discharge Barriers  Goal: Patient will be discharged home or other facility with appropriate resources  Outcome: Progressing  Flowsheets (Taken 08/06/2019 1248)  Discharge to home or other facility with appropriate resources:   Provide appropriate patient education   Initiate discharge planning     Problem: Psychosocial and Spiritual Needs  Goal: Demonstrates ability to cope with hospitalization/illness  Outcome: Progressing  Flowsheets (Taken 08/06/2019 1248)  Demonstrates ability to cope with hospitalizations/illness:   Encourage verbalization of feelings/concerns/expectations   Assist patient to identify own strengths and abilities   Provide quiet environment   Encourage participation in diversional activity   Include patient/ patient care companion in decisions     Problem: Inadequate Gas Exchange  Goal: Adequate oxygenation and improved ventilation  Outcome: Progressing  Flowsheets (Taken 08/06/2019 1248)  Adequate oxygenation and improved ventilation:   Assess lung  sounds   Monitor SpO2 and treat as needed   Teach/reinforce use of incentive spirometer 10 times per hour while awake, cough and deep breath as needed   Provide mechanical and oxygen support to facilitate gas exchange   Position for maximum ventilatory efficiency   Plan activities to conserve energy: plan rest periods   Increase activity as tolerated/progressive mobility     Problem: Inadequate Airway Clearance  Goal: Normal respiratory rate/effort achieved/maintained  Outcome: Progressing  Flowsheets (Taken 08/06/2019 1248)  Normal respiratory rate/effort achieved/maintained: Plan activities to conserve energy: plan rest periods     Pt received resting comfortably in bed, AAOx4, VSS, NSR on monitor. No c/o chest pain or dyspnea. Pt c/o R shoulder pain throughout shift, medicated PRN. Chest tube in place, initially clamped. MD rounded and unclamped chest tube, connected to suction. No bubbling noted in chamber throughout shift. Pt OOB to chair during shift.

## 2019-08-06 NOTE — UM Notes (Addendum)
[facility] Utilization Review   NPI #[id number], Tax ID [id number]  Please call Fuller Plan MSN RN CCM @ [phone] with any questions or concerns.  Email:  [email or web address]  Fax final authorization and requests for additional information to [phone]    CONCURRENT REVIEW FOR: 08/05/19- 08/06/19    LOC: IMCU    PATIENT NAME: [name],[name] / [birth year] / AGE: 29 y.o.      S/I: CHEST TUBE; WORSENING PNEUMOTHORAX       V/S:   Vital Sign Min/Max (last 24 hours)    Value Min Max   Temp 97.2 F (36.2 C) 98.3 F (36.8 C)   Heart Rate 65 95   Resp Rate 14 19   BP: Systolic 105 116   BP: Diastolic 69 79   SpO2 95 % 98 %         PAIN MAX 5/10    Radiologic Studies -   Xr Chest Ap Portable    Result Date: 08/06/2019   Enlarging right pneumothorax Vickie Slimmer, MD  08/06/2019 9:24 AM    Xr Chest Ap Portable    Result Date: 08/05/2019  Slight enlargement of a small right apical pneumothorax. Unchanged position of chest tube. 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 Gean Quint will consider pleurodesis if she develops third episode of pneumothorax      Acute kidney injury  Most likely due to volume depletion  Resolved with IV fluid hydration    Obesity  Diet exercise weight loss and lifestyle modification    Hypoglycemia  Hemoglobin A1c    Rule out UTI  Check urine culture  Empiric antibiotics in the interim    Analgesia: Morphine,  oxycodone and  Tylenol    Nutrition: Regular diet    DVT Prophylaxis: SCDs    PULMONOLOGY NOTES 08/05/19  Assessment:   Recurrent pneumothorax this is second episode  Status post thoracic vent placement  No air leak at this time    Plan:   Clamp the chest tube  Follow chest x-ray  Follow chest x-ray in the morning  If no evidence of pneumothorax we will pull the tube out and patient can be discharged  If patient continued to have recurrent pneumothorax in the future may benefit from pleurodesis      PULMONOLOGY NOTES 08/06/19  Subjective:   Chest x-ray after clamping the tube shows reaccumulation of pneumothorax  Assessment:   Recurrent pneumothorax spontaneous  Chest tube in place  Plan:   Patient still has air leak  Continue chest tube drainage  If there is no improvement in pneumothorax in the next 24 to 48-hour will consult [facility] intervention pulmonologist  Follow daily chest x-ray      MEDICINE NOTES 08/06/19  SUBJECTIVE:  [name] states she has no dyspnea   complains of pain at the site of a chest tube  A&P  Patient Active Hospital Problem List:  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 Gean Quint will consider pleurodesis if she develops third episode of pneumothorax      Acute kidney injury  Most likely due to volume depletion  Resolved with IV fluid hydration    Obesity  Diet exercise weight loss and lifestyle modification    Hypoglycemia  Hemoglobin A1c    Rule out UTI  Urine cultures are sterile  Discontinue ceftriaxone      Discussed plan with patient    Discussed with patient and bedside RN    Analgesia: Morphine,  oxycodone and Tylenol    Nutrition:  Regular diet    DVT Prophylaxis: SCDs      Current Medications:   Scheduled Meds:  Current Facility-Administered Medications   Medication Dose Route Frequency    cefTRIAXone  1 g Intravenous Q24H    senna-docusate  1 tablet Oral QHS     Continuous Infusions:  PRN Meds:.acetaminophen **OR** acetaminophen, midazolam, morphine, naloxone, ondansetron **OR** ondansetron, oxyCODONE    LAST 24 HRS  TYLENOL 650 MG--X3  OXYCODONE 5 MG PO--X3    DCP: HOME

## 2019-08-06 NOTE — Progress Notes (Signed)
PULMONARY PROGRESS NOTE                                                                                                              [phone]    Date Time: 08/06/19 5:47 PM  Patient Name: [name],[name] 29 y.o. female admitted with Recurrent spontaneous pneumothorax  Admit Date: 08/03/2019    Patient status: Inpatient  Hospital Day: 2           Assessment:     Recurrent pneumothorax spontaneous  Chest tube in place    Plan:       Patient still has air leak  Continue chest tube drainage  If there is no improvement in pneumothorax in the next 24 to 48-hour will consult [facility] intervention pulmonologist  Follow daily chest x-ray    Subjective:       No new complaints  Chest x-ray after clamping the tube shows reaccumulation of pneumothorax            Medications:     Current Facility-Administered Medications   Medication Dose Route Frequency    cefTRIAXone  1 g Intravenous Q24H    senna-docusate  1 tablet Oral QHS       Review of Systems:        General ROS:  Afebrile    ENT ROS:  No sore throat no nasal discharge   Endocrine ROS:  No fatigue   Respiratory ROS:  No shortness of breath wheezing cough chest congestion    Cardiovascular ROS:  No chest pain or palpitation   Gastrointestinal ROS:  No nausea vomiting diarrhea.  No melanotic stool   Genito-Urinary ROS:  No burning in the urine or hematuria   Musculoskeletal ROS:  No musculoskeletal deformities   Neurological ROS:  No stroke seizure disorder   Dermatological ROS:  No skin rash        Physical Exam:     Vitals:    08/06/19 1650   BP: 115/69   Pulse: 68   Resp: 17   Temp: 97.2 F (36.2 C)   SpO2: 100%         Intake/Output Summary (Last 24 hours) at 08/06/2019 1747  Last data filed at 08/05/2019 2237  Gross per 24 hour   Intake 500 ml   Output 200 ml   Net 300 ml           General appearance - no visible respiratory distress patient does not appear toxic  Mental status -  Alert and oriented x3  Eyes - EOMI PERRLA  Nose - no nasal  discharge  Mouth - mucous membrane is moist.    Neck - no JVD lymphadenopathy or thyromegaly  Chest - clear to auscultation, chest tube in place still has air leak especially when patient coughs  Heart - S1-S2 RRR no S3-S4 no murmur  Abdomen - soft nontender bowel sounds are normal with no hepatosplenomegaly  Neurological - no motor or sensory deficit  Extremities - no edema clubbing or cyanosis  Skin - no skin  rash      Labs:     CBC w/Diff CMP   Recent Labs   Lab 08/06/19  0549 08/05/19  0331 08/03/19  0427   WBC 5.92 6.89 7.77   Hgb 12.5 12.4 12.5   Hematocrit 37.8 38.3 38.6   Platelets 274 303 334   MCV 92.4 93.2 92.3   Neutrophils 54.1 50.4 73.1       PT/INR   Recent Labs   Lab 08/03/19  0427   PT INR 0.9       Recent Labs   Lab 08/06/19  0549 08/05/19  0331 08/04/19  0957  08/02/19  2142   Sodium 141 139 139  More results in Results Review 142   Potassium 4.4 4.1 3.8  More results in Results Review 4.4   Chloride 106 106 108  More results in Results Review 107   CO2 26 25 23   More results in Results Review 23   BUN 9.0 10.0 12.0  More results in Results Review 14   Creatinine 0.9 0.8 0.8  More results in Results Review 1.2*   Glucose 95 84 127*  More results in Results Review 101*   Calcium 9.3 9.0 8.8  More results in Results Review 9.4   Protein, Total  --   --   --   --  7.5   Albumin  --   --   --   --  4.3   AST (SGOT)  --   --   --   --  16   ALT  --   --   --   --  21   Alkaline Phosphatase  --   --   --   --  100   Bilirubin, Total  --   --   --   --  0.2   More results in Results Review = values in this interval not displayed.      Glucose POCT   Recent Labs   Lab 08/06/19  0549 08/05/19  0331 08/04/19  0957 08/03/19  0427 08/02/19  2142   Glucose 95 84 127* 113* 101*        Recent Labs   Lab 08/02/19  2142   Troponin I <0.01         ABGs:  No results found for: Paul Dykes, PHART, PCO2ART, PO2ART, HCO3ART, BEART, O2SATART    Urinalysis  Recent Labs   Lab 08/02/19  2142   Urine Type  Urine, Clean Ca   Color, UA Yellow   Clarity, UA Sl Cloudy*   Specific Gravity UA 1.025   Urine pH 7.0   Nitrite, UA Negative   Ketones UA Negative   Urobilinogen, UA Negative   Bilirubin, UA Negative   Blood, UA Negative   WBC, UA 11 - 25*         Rads:   Xr Chest Ap Portable    Result Date: 08/06/2019   Enlarging right pneumothorax [name], MD  08/06/2019 9:24 AM    Xr Chest Ap Portable    Result Date: 08/05/2019  Slight enlargement of a small right apical pneumothorax. Unchanged position of chest tube. [name], DO  08/05/2019 7:44 PM        Gean Quint MD  08/06/2019  5:47 PM

## 2019-08-07 ENCOUNTER — Inpatient Hospital Stay: Payer: Medicaid Other

## 2019-08-07 LAB — BASIC METABOLIC PANEL
Anion Gap: 9 (ref 5.0–15.0)
BUN: 8 mg/dL (ref 7.0–19.0)
CO2: 24 mEq/L (ref 22–29)
Calcium: 9.1 mg/dL (ref 8.5–10.5)
Chloride: 108 mEq/L (ref 100–111)
Creatinine: 0.8 mg/dL (ref 0.6–1.0)
Glucose: 97 mg/dL (ref 70–100)
Potassium: 4.1 mEq/L (ref 3.5–5.1)
Sodium: 141 mEq/L (ref 136–145)

## 2019-08-07 LAB — GFR: EGFR: >60

## 2019-08-07 LAB — CBC AND DIFFERENTIAL
Absolute NRBC: 0 10*3/uL (ref 0.00–0.00)
Basophils Absolute Automated: 0.03 10*3/uL (ref 0.00–0.08)
Basophils Automated: 0.6 %
Eosinophils Absolute Automated: 0.18 10*3/uL (ref 0.00–0.44)
Eosinophils Automated: 3.7 %
Hematocrit: 37.9 % (ref 34.7–43.7)
Hgb: 12.3 g/dL (ref 11.4–14.8)
Immature Granulocytes Absolute: 0.01 10*3/uL (ref 0.00–0.07)
Immature Granulocytes: 0.2 %
Lymphocytes Absolute Automated: 2.01 10*3/uL (ref 0.42–3.22)
Lymphocytes Automated: 41.8 %
MCH: 29.9 pg (ref 25.1–33.5)
MCHC: 32.5 g/dL (ref 31.5–35.8)
MCV: 92 fL (ref 78.0–96.0)
MPV: 9.1 fL (ref 8.9–12.5)
Monocytes Absolute Automated: 0.35 10*3/uL (ref 0.21–0.85)
Monocytes: 7.3 %
Neutrophils Absolute: 2.23 10*3/uL (ref 1.10–6.33)
Neutrophils: 46.4 %
Nucleated RBC: 0 /100 WBC (ref 0.0–0.0)
Platelets: 267 10*3/uL (ref 142–346)
RBC: 4.12 10*6/uL (ref 3.90–5.10)
RDW: 12 % (ref 11–15)
WBC: 4.81 10*3/uL (ref 3.10–9.50)

## 2019-08-07 LAB — HEMOLYSIS INDEX: Hemolysis Index: 13 (ref 0–18)

## 2019-08-07 NOTE — Progress Notes (Addendum)
CCM Attending Physician   Progress Note                                                                                                                  Patient Name: [name],[name] 29 y.o. female admitted with Recurrent spontaneous pneumothorax  Admit Date: 08/03/2019    Patient status: Inpatient          Assessment & Plan :     Recurrent spontaneous pneumothorax status post chest tube  AKI-likely secondary to volume depletion which is now significantly resolved    Chest tube in place intact  Ongoing air leak  Chest x-ray shows small right apical pneumothorax which is significantly decreased in size with some new left subsegmental atelectasis  Encourage incentive spirometry as tolerated  If she has recurrence of pneumothorax, might need pleurodesis  Chest x-ray tomorrow      Discussed with nursing staff.  Discussed with patient    Subjective:         Fever No      Eating Yes   Cough No     Diarrhea No     Sleep Yes   Voiding Yes           Review of Systems:        General ROS:  Afebrile no weight loss weight gain   ENT ROS:  No sore throat no nasal discharge   Endocrine ROS:  No fatigue   Respiratory ROS:  No shortness of breath wheezing cough chest congestion    Cardiovascular ROS:  No chest pain or palpitation   Gastrointestinal ROS:  No nausea vomiting diarrhea.  No melanotic stool   Genito-Urinary ROS:  No burning in the urine or hematuria   Musculoskeletal ROS:  No musculoskeletal deformities   Neurological ROS:  No stroke seizure disorder   Dermatological ROS:  No skin rash        Physical Exam:             General: awake, alert, conversant; no acute distress  HEENT: anicteric sclerae;  Cardiovascular: no overt peripheral edema, no JVD appreciated  Respiratory: no visible respiratory distress or accessory muscle use; noted symmetric chest wall movement  Chest tube in a treatment place  Abdomen: not distended  Extremities: no overt edema; no visible cyanosis  Skin: no rashes or lesions noted  on limited exam  Neuro: symmetric facial movements, clear speech, moving all extremities  Psych: oriented x3;  mood and affect appropriate                Labs:   Reviewed in EMR      Rads:   Reviewed in EMR                Services included the following: chart data review, reviewing nursing notes and/or old charts, documentation time, consultant collaboration regarding findings and treatment options, medication orders and management, communication with RNs, direct patient care, re-evaluations, vital sign assessments and ordering, interpreting, reviewing diagnostic studies/lab tests.  Time was spent in complex care decision making based on these data. It did not include time spent performing other reported procedures and not overlapping with any other providers or .the services of residents, students, nurses , NPs or physician assistants.    Susa Griffins MD, MSPH          This note was generated by the Epic EMR system/ Dragon speech recognition and may contain inherent errors or omissions not intended by the user. Grammatical errors, random word insertions, deletions, pronoun errors and incomplete sentences are occasional consequences of this technology due to software limitations. Not all errors are caught or corrected. If there are questions or concerns about the content of this note or information contained within the body of this dictation they should be addressed directly with the author for clarification.

## 2019-08-07 NOTE — Progress Notes (Signed)
[facility]  PROGRESS NOTE      Patient: [name]  Date: 08/07/2019   LOS: 3 Days  Admission Date: 08/03/2019   MRN: [id number]  Attending: Gwyndolyn Kaufman  Please contact me on the following Spectralink [phone]       ASSESSMENT/PLAN     [name] is a 29 y.o. 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 Gean Quint will consider pleurodesis if she develops third episode of pneumothorax  If pneumothorax does not resolve we will consider evaluation by interventional pulmonologist at [facility]      Acute kidney injury  Most likely due to volume depletion  Resolved with IV fluid hydration    Obesity  Diet exercise weight loss and lifestyle modification    Hyperglycemia  Hemoglobin A1c    Rule out UTI  Urine cultures are sterile  Discontinue ceftriaxone      Discussed plan with patient    Discussed with patient and bedside RN    Analgesia: Morphine,  oxycodone and Tylenol    Nutrition: Regular diet    DVT Prophylaxis: SCDs       Code Status: Full code    DISPO: To be determined    Family Contact: Patient's grandmother       SUBJECTIVE     [name] states she has no dyspnea   complains of pain at the site of a chest tube otherwise no new complaints    MEDICATIONS     Current Facility-Administered Medications   Medication Dose Route Frequency    senna-docusate  1 tablet Oral QHS       PHYSICAL EXAM     Vitals:    08/07/19 0829   BP: 116/72   Pulse: 76   Resp: 20   Temp: 97.8 F (36.6 C)   SpO2:        Temperature: Temp  Min:  97.8 F (36.6 C)  Max: 97.9 F (36.6 C)  Pulse: Pulse  Min: 67  Max: 86  Respiratory: Resp  Min: 17  Max: 20  Non-Invasive BP: BP  Min: 109/60  Max: 119/73  Pulse Oximetry SpO2  Min: 97 %  Max: 98 %    Intake and Output Summary (Last 24 hours) at Date Time  No intake or output data in the 24 hours ending 08/07/19 1815    GEN APPEARANCE: Obese female in no acute distress A&OX3  HEENT: PERLA; EOMI; Conjunctiva Clear  NECK: Supple; No bruits  CVS: RRR, S1, S2; No M/G/R  LUNGS: CTAB; No Wheezes; No Rhonchi: No rales  ABD: Soft; No TTP; + Normoactive BS  EXT: No edema; Pulses 2+ and intact  Skin exam: Warm and dry  NEURO: CN 2-12 intact; No Focal neurological deficits  CAP REFILL: Less than 3 seconds capillary refill  MENTAL STATUS: Awake alert and oriented x3          LABS     Recent Labs   Lab 08/07/19  0432 08/06/19  0549 08/05/19  0331  WBC 4.81 5.92 6.89   RBC 4.12 4.09 4.11   Hgb 12.3 12.5 12.4   Hematocrit 37.9 37.8 38.3   MCV 92.0 92.4 93.2   Platelets 267 274 303       Recent Labs   Lab 08/07/19  0432 08/06/19  0549 08/05/19  0331 08/04/19  0957 08/03/19  0427   Sodium 141 141 139 139 140   Potassium 4.1 4.4 4.1 3.8 4.7   Chloride 108 106 106 108 109   CO2 24 26 25 23  20*   BUN 8.0 9.0 10.0 12.0 16.0   Creatinine 0.8 0.9 0.8 0.8 0.9   Glucose 97 95 84 127* 113*   Calcium 9.1 9.3 9.0 8.8 9.1       Recent Labs   Lab 08/02/19  2142   ALT 21   AST (SGOT) 16   Bilirubin, Total 0.2   Albumin 4.3   Alkaline Phosphatase 100       Recent Labs   Lab 08/02/19  2142   Troponin I <0.01       Recent Labs   Lab 08/03/19  0427   PT INR 0.9   PT 12.4*       Microbiology Results     Procedure Component Value Units Date/Time    Adult Urine culture [[id number]] Collected: 08/03/19 1341    Specimen: Urine, Clean Catch Updated: 08/03/19 1341    Narrative:      R/O UTI  ABNORMAL U/A  Replace urinary catheter prior to obtaining the urine culture  if it has been in place for greater than or equal to 14  days:->N/A No  Foley  Indications for Urine Culture:->Other (please specify in  Comments)    COVID-19 (SARS-COV-2) [name] Rapid) [[id number]] Collected: 08/03/19 0238    Specimen: Nasopharyngeal Swab from Nasopharynx Updated: 08/03/19 0314     Purpose of COVID testing Screening     SARS-CoV-2 Specimen Source Nasopharyngeal     SARS CoV 2 Overall Result Negative     Comment: Test performed using the Abbott ID NOW EUA assay.  Please see Fact Sheets for patients and providers located at:  http://www.rice.biz/  This test is for the qualitative detection of SARS-CoV-2  (COVID19) nucleic acid. Viral nucleic acids may persist in vivo,  independent of viability. Detection of viral nucleic acid does  not imply the presence of infectious virus, or that virus  nucleic acid is the cause of clinical symptoms. Negative  results should be treated as presumptive and, if inconsistent  with clinical signs and symptoms or necessary for patient  management, should be tested with an alternative molecular  assay. Negative results do not preclude SARS-CoV-2 infection  and should not be used as the sole basis for patient  management decisions. Invalid results may be due to inhibiting  substances in the specimen and recollection should occur.         Narrative:      o Collect and clearly label specimen type:  o Upper respiratory specimen: One Nasopharyngeal Dry Swab NO  Transport Media.  o Hand deliver to laboratory ASAP  Indication for testing->Extended care facility admission to  semi private room  Rescheduled by [id number] at 08/03/2019 02:35 Reason: Printed by   mistake/Printing Issues.           RADIOLOGY     Upon my review:    [name]  6:15 PM 08/07/2019

## 2019-08-07 NOTE — Plan of Care (Signed)
Problem: Safety  Goal: Patient will be free from injury during hospitalization  Outcome: Progressing  Flowsheets (Taken 08/07/2019 0439)  Patient will be free from injury during hospitalization:   Assess patient's risk for falls and implement fall prevention plan of care per policy   Provide and maintain safe environment   Use appropriate transfer methods   Ensure appropriate safety devices are available at the bedside   Include patient/ family/ care giver in decisions related to safety   Hourly rounding     Problem: Safety  Goal: Patient will be free from infection during hospitalization  Outcome: Progressing  Flowsheets (Taken 08/07/2019 0439)  Free from Infection during hospitalization:   Assess and monitor for signs and symptoms of infection   Monitor lab/diagnostic results     Problem: Inadequate Gas Exchange  Goal: Adequate oxygenation and improved ventilation  Outcome: Progressing  Flowsheets (Taken 08/07/2019 0439)  Adequate oxygenation and improved ventilation:   Assess lung sounds   Monitor SpO2 and treat as needed   Position for maximum ventilatory efficiency   Teach/reinforce use of incentive spirometer 10 times per hour while awake, cough and deep breath as needed   Plan activities to conserve energy: plan rest periods   Increase activity as tolerated/progressive mobility     Problem: Inadequate Airway Clearance  Goal: Normal respiratory rate/effort achieved/maintained  Outcome: Progressing  Flowsheets (Taken 08/07/2019 0439)  Normal respiratory rate/effort achieved/maintained: Plan activities to conserve energy: plan rest periods     Problem: Pain  Goal: Pain at adequate level as identified by patient  Outcome: Progressing  Flowsheets (Taken 08/07/2019 0440)  Pain at adequate level as identified by patient:   Identify patient comfort function goal   Assess for risk of opioid induced respiratory depression, including snoring/sleep apnea. Alert healthcare team of risk factors identified.   Assess pain on  admission, during daily assessment and/or before any "as needed" intervention(s)   Reassess pain within 30-60 minutes of any procedure/intervention, per Pain Assessment, Intervention, Reassessment (AIR) Cycle   Evaluate if patient comfort function goal is met     Pt A/Ox4. MAE up to [facility] w/standby assist.  C/o 4/10 r shoulder pain; Gave ordered dose of tylenol and oxycodone.  Informed pt of dosing schedule and the importance of good pain control.  Pt verbalized understanding. No further complaints of pain.  VSS.  NSR on tele.  Chest tube in place secured to the right chest wall and connected to wall suction;  no bubbling is in the chamber noted.  Pt voiding w/o difficulty.   Safety measures in place.  Will continue to monitor.

## 2019-08-07 NOTE — Plan of Care (Addendum)
Problem: Safety  Goal: Patient will be free from injury during hospitalization  Outcome: Progressing  Flowsheets (Taken 08/07/2019 1135)  Patient will be free from injury during hospitalization:   Assess patient's risk for falls and implement fall prevention plan of care per policy   Provide and maintain safe environment   Ensure appropriate safety devices are available at the bedside   Use appropriate transfer methods   Include patient/ family/ care giver in decisions related to safety   Hourly rounding     Problem: Pain  Goal: Pain at adequate level as identified by patient  Outcome: Progressing  Flowsheets (Taken 08/07/2019 1135)  Pain at adequate level as identified by patient:   Identify patient comfort function goal   Assess for risk of opioid induced respiratory depression, including snoring/sleep apnea. Alert healthcare team of risk factors identified.   Assess pain on admission, during daily assessment and/or before any "as needed" intervention(s)   Reassess pain within 30-60 minutes of any procedure/intervention, per Pain Assessment, Intervention, Reassessment (AIR) Cycle   Evaluate if patient comfort function goal is met   Evaluate patient's satisfaction with pain management progress   Offer non-pharmacological pain management interventions   Include patient/patient care companion in decisions related to pain management as needed     Problem: Side Effects from Pain Analgesia  Goal: Patient will experience minimal side effects of analgesic therapy  Outcome: Progressing  Flowsheets (Taken 08/07/2019 1135)  Patient will experience minimal side effects of analgesic therapy:   Monitor/assess patient's respiratory status (RR depth, effort, breath sounds)   Assess for changes in cognitive function   Prevent/manage side effects per LIP orders (i.e. nausea, vomiting, pruritus, constipation, urinary retention, etc.)   Evaluate for opioid-induced sedation with appropriate assessment tool (i.e.  POSS)     Problem: Psychosocial and Spiritual Needs  Goal: Demonstrates ability to cope with hospitalization/illness  Outcome: Progressing  Flowsheets (Taken 08/07/2019 1135)  Demonstrates ability to cope with hospitalizations/illness:   Encourage verbalization of feelings/concerns/expectations   Provide quiet environment   Encourage patient to set small goals for self   Encourage participation in diversional activity   Include patient/ patient care companion in decisions     Problem: Compromised Hemodynamic Status  Goal: Vital signs and fluid balance maintained/improved  Outcome: Progressing  Flowsheets (Taken 08/07/2019 1135)  Vital signs and fluid balance are maintained/improved:   Position patient for maximum circulation/cardiac output   Monitor/assess vitals and hemodynamic parameters with position changes   Monitor intake and output. Notify LIP if urine output is less than 30 mL/hour.   Monitor and compare daily weight   Monitor/assess lab values and report abnormal values     Problem: Inadequate Gas Exchange  Goal: Adequate oxygenation and improved ventilation  Outcome: Progressing  Flowsheets (Taken 08/07/2019 1135)  Adequate oxygenation and improved ventilation:   Assess lung sounds   Monitor SpO2 and treat as needed   Provide mechanical and oxygen support to facilitate gas exchange   Position for maximum ventilatory efficiency   Teach/reinforce use of incentive spirometer 10 times per hour while awake, cough and deep breath as needed   Plan activities to conserve energy: plan rest periods   Increase activity as tolerated/progressive mobility     Problem: Inadequate Airway Clearance  Goal: Normal respiratory rate/effort achieved/maintained  Outcome: Progressing  Flowsheets (Taken 08/07/2019 1135)  Normal respiratory rate/effort achieved/maintained: Plan activities to conserve energy: plan rest periods     Pt received resting comfortably in bed, AAOx4, VSS, NSR on  monitor. No c/o chest pain or  dyspnea. Pt c/o moderate pain in R shoulder/back, medicated PRN throughout shift. Chest tube in place to wall suction, no bubbling noted in chamber.  Pt OOB to commode throughout shift w/ standby assistance. Pt prefers 4 side rails up while in bed, charge RN aware.

## 2019-08-08 ENCOUNTER — Inpatient Hospital Stay: Payer: Medicaid Other

## 2019-08-08 LAB — CBC AND DIFFERENTIAL
Absolute NRBC: 0 10*3/uL (ref 0.00–0.00)
Basophils Absolute Automated: 0.02 10*3/uL (ref 0.00–0.08)
Basophils Automated: 0.4 %
Eosinophils Absolute Automated: 0.16 10*3/uL (ref 0.00–0.44)
Eosinophils Automated: 2.9 %
Hematocrit: 37.2 % (ref 34.7–43.7)
Hgb: 11.9 g/dL (ref 11.4–14.8)
Immature Granulocytes Absolute: 0.01 10*3/uL (ref 0.00–0.07)
Immature Granulocytes: 0.2 %
Lymphocytes Absolute Automated: 2.22 10*3/uL (ref 0.42–3.22)
Lymphocytes Automated: 40.3 %
MCH: 29.8 pg (ref 25.1–33.5)
MCHC: 32 g/dL (ref 31.5–35.8)
MCV: 93.2 fL (ref 78.0–96.0)
MPV: 9.5 fL (ref 8.9–12.5)
Monocytes Absolute Automated: 0.39 10*3/uL (ref 0.21–0.85)
Monocytes: 7.1 %
Neutrophils Absolute: 2.71 10*3/uL (ref 1.10–6.33)
Neutrophils: 49.1 %
Nucleated RBC: 0 /100 WBC (ref 0.0–0.0)
Platelets: 288 10*3/uL (ref 142–346)
RBC: 3.99 10*6/uL (ref 3.90–5.10)
RDW: 12 % (ref 11–15)
WBC: 5.51 10*3/uL (ref 3.10–9.50)

## 2019-08-08 LAB — BASIC METABOLIC PANEL
Anion Gap: 9 (ref 5.0–15.0)
BUN: 9 mg/dL (ref 7.0–19.0)
CO2: 24 mEq/L (ref 22–29)
Calcium: 9.2 mg/dL (ref 8.5–10.5)
Chloride: 106 mEq/L (ref 100–111)
Creatinine: 0.8 mg/dL (ref 0.6–1.0)
Glucose: 91 mg/dL (ref 70–100)
Potassium: 4.2 mEq/L (ref 3.5–5.1)
Sodium: 139 mEq/L (ref 136–145)

## 2019-08-08 LAB — GFR: EGFR: >60

## 2019-08-08 LAB — HEMOLYSIS INDEX: Hemolysis Index: 1 (ref 0–18)

## 2019-08-08 NOTE — Progress Notes (Signed)
[facility]  PROGRESS NOTE      Patient: [name]  Date: 08/08/2019   LOS: 4 Days  Admission Date: 08/03/2019   MRN: [id number]  Attending: Gwyndolyn Kaufman  Please contact me on the following Spectralink [id number]       ASSESSMENT/PLAN     Nini Roudebush is a 29 y.o. 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 Gean Quint will consider pleurodesis if she develops third episode of pneumothorax  If pneumothorax does not resolve we will consider evaluation by interventional pulmonologist at [facility]  Plan to follow-up chest x-ray tomorrow and decide on disposition      Acute kidney injury  Most likely due to volume depletion  Resolved with IV fluid hydration    Obesity  Diet exercise weight loss and lifestyle modification    Hyperglycemia  Hemoglobin A1c    Rule out UTI  Urine cultures are sterile  Discontinue ceftriaxone      Discussed plan with patient    Discussed with patient and bedside RN    Analgesia: Morphine,  oxycodone and Tylenol    Nutrition: Regular diet    DVT Prophylaxis: SCDs       Code Status: Full code    DISPO: To be determined    Family Contact: Patient's grandmother       SUBJECTIVE     [name] states she has no dyspnea   complains of pain at the site of a chest tube otherwise no new complaints    MEDICATIONS     Current Facility-Administered Medications   Medication Dose Route Frequency    senna-docusate  1 tablet Oral QHS       PHYSICAL EXAM     Vitals:    08/08/19 1609   BP: 112/72   Pulse: 62   Resp:  17   Temp: 97.9 F (36.6 C)   SpO2: 100%       Temperature: Temp  Min: 97.6 F (36.4 C)  Max: 98 F (36.7 C)  Pulse: Pulse  Min: 60  Max: 91  Respiratory: Resp  Min: 17  Max: 19  Non-Invasive BP: BP  Min: 111/65  Max: 122/79  Pulse Oximetry SpO2  Min: 96 %  Max: 100 %    Intake and Output Summary (Last 24 hours) at Date Time  No intake or output data in the 24 hours ending 08/08/19 1911    GEN APPEARANCE: Obese female in no acute distress A&OX3  HEENT: PERLA; EOMI; Conjunctiva Clear  NECK: Supple; No bruits  CVS: RRR, S1, S2; No M/G/R  LUNGS: CTAB; No Wheezes; No Rhonchi: No rales  ABD: Soft; No TTP; + Normoactive BS  EXT: No edema; Pulses 2+ and intact  Skin exam: Warm and dry  NEURO: CN 2-12 intact; No Focal neurological deficits  CAP REFILL: Less than 3 seconds capillary refill  MENTAL STATUS: Awake alert and oriented x3          LABS     Recent Labs  Lab 08/08/19  0347 08/07/19  0432 08/06/19  0549   WBC 5.51 4.81 5.92   RBC 3.99 4.12 4.09   Hgb 11.9 12.3 12.5   Hematocrit 37.2 37.9 37.8   MCV 93.2 92.0 92.4   Platelets 288 267 274       Recent Labs   Lab 08/08/19  0347 08/07/19  0432 08/06/19  0549 08/05/19  0331 08/04/19  0957   Sodium 139 141 141 139 139   Potassium 4.2 4.1 4.4 4.1 3.8   Chloride 106 108 106 106 108   CO2 24 24 26 25 23    BUN 9.0 8.0 9.0 10.0 12.0   Creatinine 0.8 0.8 0.9 0.8 0.8   Glucose 91 97 95 84 127*   Calcium 9.2 9.1 9.3 9.0 8.8       Recent Labs   Lab 08/02/19  2142   ALT 21   AST (SGOT) 16   Bilirubin, Total 0.2   Albumin 4.3   Alkaline Phosphatase 100       Recent Labs   Lab 08/02/19  2142   Troponin I <0.01       Recent Labs   Lab 08/03/19  0427   PT INR 0.9   PT 12.4*       Microbiology Results     Procedure Component Value Units Date/Time    Adult Urine culture [[id number]] Collected: 08/03/19 1341    Specimen: Urine, Clean Catch Updated: 08/03/19 1341    Narrative:      R/O UTI  ABNORMAL U/A  Replace urinary catheter prior to obtaining the urine culture  if it has been in  place for greater than or equal to 14  days:->N/A No Foley  Indications for Urine Culture:->Other (please specify in  Comments)    COVID-19 (SARS-COV-2) [facility] Rapid) [[id number]] Collected: 08/03/19 0238    Specimen: Nasopharyngeal Swab from Nasopharynx Updated: 08/03/19 0314     Purpose of COVID testing Screening     SARS-CoV-2 Specimen Source Nasopharyngeal     SARS CoV 2 Overall Result Negative     Comment: Test performed using the Abbott ID NOW EUA assay.  Please see Fact Sheets for patients and providers located at:  http://www.rice.biz/  This test is for the qualitative detection of SARS-CoV-2  (COVID19) nucleic acid. Viral nucleic acids may persist in vivo,  independent of viability. Detection of viral nucleic acid does  not imply the presence of infectious virus, or that virus  nucleic acid is the cause of clinical symptoms. Negative  results should be treated as presumptive and, if inconsistent  with clinical signs and symptoms or necessary for patient  management, should be tested with an alternative molecular  assay. Negative results do not preclude SARS-CoV-2 infection  and should not be used as the sole basis for patient  management decisions. Invalid results may be due to inhibiting  substances in the specimen and recollection should occur.         Narrative:      o Collect and clearly label specimen type:  o Upper respiratory specimen: One Nasopharyngeal Dry Swab NO  Transport Media.  o Hand deliver to laboratory ASAP  Indication for testing->Extended care facility admission to  semi private room  Rescheduled by [id number] at 08/03/2019 02:35 Reason: Printed by   mistake/Printing Issues.           RADIOLOGY     Upon my review:    Signed,  Gwyndolyn Kaufman  7:11 PM 08/08/2019

## 2019-08-08 NOTE — Progress Notes (Signed)
[facility] Attending Physician   Progress Note                                                                                                                  Patient Name: [name],[name] 29 y.o. female admitted with Recurrent spontaneous pneumothorax  Admit Date: 08/03/2019    Patient status: Inpatient          Assessment & Plan :     Recurrent spontaneous pneumothorax status post chest tube  AKI-likely secondary to volume depletion which is now significantly resolved    Chest tube on right side  intact  Chest x-ray shows no appreciable pneumothorax   Encourage incentive spirometry as tolerated  Chest x-ray tomorrow and if no pneumothorax noted, will proceed with clamping trial      Discussed with nursing staff.  Discussed with patient    Subjective:         Fever No      Eating Yes   Cough No     Diarrhea No     Sleep Yes   Voiding Yes           Review of Systems:        General ROS:  Afebrile no weight loss weight gain   ENT ROS:  No sore throat no nasal discharge   Endocrine ROS:  No fatigue   Respiratory ROS:  No shortness of breath wheezing cough chest congestion    Cardiovascular ROS:  No chest pain or palpitation   Gastrointestinal ROS:  No nausea vomiting diarrhea.  No melanotic stool   Genito-Urinary ROS:  No burning in the urine or hematuria   Musculoskeletal ROS:  No musculoskeletal deformities   Neurological ROS:  No stroke seizure disorder   Dermatological ROS:  No skin rash        Physical Exam:             General: awake, alert, conversant; no acute distress  HEENT: anicteric sclerae;  Cardiovascular: no overt peripheral edema, no JVD appreciated  Respiratory: no visible respiratory distress or accessory muscle use; noted symmetric chest wall movement  Chest tube in a treatment place  Abdomen: not distended  Extremities: no overt edema; no visible cyanosis  Skin: no rashes or lesions noted on limited exam  Neuro: symmetric facial movements, clear speech, moving all extremities  Psych:  oriented x3;  mood and affect appropriate                Labs:   Reviewed in EMR      Rads:   Reviewed in EMR                Services included the following: chart data review, reviewing nursing notes and/or old charts, documentation time, consultant collaboration regarding findings and treatment options, medication orders and management, communication with RNs, direct patient care, re-evaluations, vital sign assessments and ordering, interpreting, reviewing diagnostic studies/lab tests.    Time was spent in complex care decision making based on these data.  It did not include time spent performing other reported procedures and not overlapping with any other providers or .the services of residents, students, nurses , NPs or physician assistants.    Susa Griffins MD, MSPH          This note was generated by the Epic EMR system/ Dragon speech recognition and may contain inherent errors or omissions not intended by the user. Grammatical errors, random word insertions, deletions, pronoun errors and incomplete sentences are occasional consequences of this technology due to software limitations. Not all errors are caught or corrected. If there are questions or concerns about the content of this note or information contained within the body of this dictation they should be addressed directly with the author for clarification.

## 2019-08-08 NOTE — Plan of Care (Signed)
Problem: Safety  Goal: Patient will be free from injury during hospitalization  Outcome: Progressing  Flowsheets (Taken 08/08/2019 0453)  Patient will be free from injury during hospitalization:   Assess patient's risk for falls and implement fall prevention plan of care per policy   Provide and maintain safe environment   Use appropriate transfer methods   Ensure appropriate safety devices are available at the bedside   Hourly rounding     Problem: Pain  Goal: Pain at adequate level as identified by patient  Outcome: Progressing  Flowsheets (Taken 08/08/2019 0453)  Pain at adequate level as identified by patient:   Identify patient comfort function goal   Assess for risk of opioid induced respiratory depression, including snoring/sleep apnea. Alert healthcare team of risk factors identified.   Reassess pain within 30-60 minutes of any procedure/intervention, per Pain Assessment, Intervention, Reassessment (AIR) Cycle   Evaluate if patient comfort function goal is met   Evaluate patient's satisfaction with pain management progress   Consult/collaborate with Pain Service     Problem: Inadequate Gas Exchange  Goal: Patent Airway maintained  Outcome: Progressing  Flowsheets (Taken 08/08/2019 0453)  Patent airway maintained:   Position patient for maximum ventilatory efficiency   Provide adequate fluid intake to liquefy secretions   Reposition patient every 2 hours and as needed unless able to self-reposition    Patient OOB to bedside commode, BM x1 this shift. C/o of moderate [ain R chest and back, prn medications given with relief. Pain management, monitor labs.

## 2019-08-08 NOTE — Plan of Care (Addendum)
PT AOX4, VSS,complains pain 4/10  Around chest tube insertion. Pt at bedrest.     Problem: Safety  Goal: Patient will be free from injury during hospitalization  Outcome: Progressing  Goal: Patient will be free from infection during hospitalization  Outcome: Progressing     Problem: Pain  Goal: Pain at adequate level as identified by patient  Outcome: Progressing     Problem: Side Effects from Pain Analgesia  Goal: Patient will experience minimal side effects of analgesic therapy  Outcome: Progressing     Problem: Discharge Barriers  Goal: Patient will be discharged home or other facility with appropriate resources  Outcome: Progressing     Problem: Psychosocial and Spiritual Needs  Goal: Demonstrates ability to cope with hospitalization/illness  Outcome: Progressing     Problem: Compromised Tissue integrity  Goal: Damaged tissue is healing and protected  Outcome: Progressing  Goal: Nutritional status is improving  Outcome: Progressing     Problem: Moderate/High Fall Risk Score >5  Goal: Patient will remain free of falls  Outcome: Progressing     Problem: Compromised Hemodynamic Status  Goal: Vital signs and fluid balance maintained/improved  Outcome: Progressing     Problem: Inadequate Gas Exchange  Goal: Adequate oxygenation and improved ventilation  Outcome: Progressing  Goal: Patent Airway maintained  Outcome: Progressing     Problem: Inadequate Airway Clearance  Goal: Normal respiratory rate/effort achieved/maintained  Outcome: Progressing     Problem: Impaired Mobility  Goal: Mobility/Activity is maintained at optimal level for patient  Outcome: Progressing     Problem: Pain  Goal: Pain at adequate level as identified by patient  Outcome: Progressing     Problem: Side Effects from Pain Analgesia  Goal: Patient will experience minimal side effects of analgesic therapy  Outcome: Progressing     Problem: Discharge Barriers  Goal: Patient will be discharged home or other facility with appropriate  resources  Outcome: Progressing     Problem: Psychosocial and Spiritual Needs  Goal: Demonstrates ability to cope with hospitalization/illness  Outcome: Progressing     Problem: Compromised Tissue integrity  Goal: Damaged tissue is healing and protected  Outcome: Progressing

## 2019-08-09 ENCOUNTER — Inpatient Hospital Stay: Payer: Medicaid Other

## 2019-08-09 LAB — BASIC METABOLIC PANEL
Anion Gap: 10 (ref 5.0–15.0)
BUN: 15 mg/dL (ref 7.0–19.0)
CO2: 24 mEq/L (ref 22–29)
Calcium: 9.6 mg/dL (ref 8.5–10.5)
Chloride: 104 mEq/L (ref 100–111)
Creatinine: 0.9 mg/dL (ref 0.6–1.0)
Glucose: 80 mg/dL (ref 70–100)
Potassium: 4.2 mEq/L (ref 3.5–5.1)
Sodium: 138 mEq/L (ref 136–145)

## 2019-08-09 LAB — CBC AND DIFFERENTIAL
Absolute NRBC: 0 10*3/uL (ref 0.00–0.00)
Basophils Absolute Automated: 0.04 10*3/uL (ref 0.00–0.08)
Basophils Automated: 0.8 %
Eosinophils Absolute Automated: 0.14 10*3/uL (ref 0.00–0.44)
Eosinophils Automated: 2.7 %
Hematocrit: 39.9 % (ref 34.7–43.7)
Hgb: 13.1 g/dL (ref 11.4–14.8)
Immature Granulocytes Absolute: 0.01 10*3/uL (ref 0.00–0.07)
Immature Granulocytes: 0.2 %
Lymphocytes Absolute Automated: 2.04 10*3/uL (ref 0.42–3.22)
Lymphocytes Automated: 39.5 %
MCH: 30.5 pg (ref 25.1–33.5)
MCHC: 32.8 g/dL (ref 31.5–35.8)
MCV: 92.8 fL (ref 78.0–96.0)
MPV: 10.9 fL (ref 8.9–12.5)
Monocytes Absolute Automated: 0.35 10*3/uL (ref 0.21–0.85)
Monocytes: 6.8 %
Neutrophils Absolute: 2.58 10*3/uL (ref 1.10–6.33)
Neutrophils: 50 %
Nucleated RBC: 0 /100 WBC (ref 0.0–0.0)
Platelets: 150 10*3/uL (ref 142–346)
RBC: 4.3 10*6/uL (ref 3.90–5.10)
RDW: 12 % (ref 11–15)
WBC: 5.16 10*3/uL (ref 3.10–9.50)

## 2019-08-09 LAB — GFR: EGFR: >60

## 2019-08-09 LAB — HEMOLYSIS INDEX: Hemolysis Index: 17 (ref 0–18)

## 2019-08-09 NOTE — Plan of Care (Signed)
VSS all night. Aox4. Room Air. O2 sats 99%; I.S.: 1500. Chest tube has 0ml output. Pain medication given tylenol and oxycodone. Currently resting. No complaints.       Problem: Pain  Goal: Pain at adequate level as identified by patient  Outcome: Progressing  Flowsheets (Taken 08/08/2019 0453 by Midge Aver, RN)  Pain at adequate level as identified by patient:   Identify patient comfort function goal   Assess for risk of opioid induced respiratory depression, including snoring/sleep apnea. Alert healthcare team of risk factors identified.   Reassess pain within 30-60 minutes of any procedure/intervention, per Pain Assessment, Intervention, Reassessment (AIR) Cycle   Evaluate if patient comfort function goal is met   Evaluate patient's satisfaction with pain management progress   Consult/collaborate with Pain Service     Problem: Inadequate Gas Exchange  Goal: Adequate oxygenation and improved ventilation  Outcome: Progressing  Flowsheets (Taken 08/07/2019 1135 by Sharlynn Oliphant, RN)  Adequate oxygenation and improved ventilation:   Assess lung sounds   Monitor SpO2 and treat as needed   Provide mechanical and oxygen support to facilitate gas exchange   Position for maximum ventilatory efficiency   Teach/reinforce use of incentive spirometer 10 times per hour while awake, cough and deep breath as needed   Plan activities to conserve energy: plan rest periods   Increase activity as tolerated/progressive mobility

## 2019-08-09 NOTE — Progress Notes (Signed)
PROGRESS NOTE    Date Time: 08/09/19 11:18 AM  Patient Name: [name]      Subjective:   Admitted on 2/23 with recurrent right spontaneous pneumothorax status post chest tube placement.  2 L O2 94%  Chest x-ray no pneumothorax.    Medications:      Scheduled Meds: PRN Meds:    senna-docusate, 1 tablet, Oral, QHS          Continuous Infusions:   acetaminophen, 650 mg, Q6H PRN    Or  acetaminophen, 650 mg, Q6H PRN  midazolam, 5 mg, Once PRN  naloxone, 0.2 mg, PRN  ondansetron, 4 mg, Q6H PRN    Or  ondansetron, 4 mg, Q6H PRN  oxyCODONE, 5 mg, Q4H PRN            Review of Systems:   A comprehensive review of systems was: General ROS:[x]    negative other than :  Respiratory ROS:[]  cough,[] shortness of breath,  []  wheezing  Cardiovascular ROS:  []  chest pain []  dyspnea on exertion  Gastrointestinal ROS: []  abdominal pain, [] change in bowel habits,  [] black/ bloody stools  Genito-Urinary ROS: []  dysuria,[]  trouble voiding,[]  hematuria  Musculoskeletal ROS:[]  negative  Neurological ROS:[]  negative    Physical Exam:     Vitals:    08/09/19 0924   BP:    Pulse: 77   Resp:    Temp:    SpO2:        Intake and Output Summary (Last 24 hours) at Date Time    Intake/Output Summary (Last 24 hours) at 08/09/2019 1118  Last data filed at 08/09/2019 0543  Gross per 24 hour   Intake --   Output 0 ml   Net 0 ml       General appearance [x]  alert, [] well appearing,  [] no distress  Eyes -[]  pupils equal and[] reactive, -[]   extraocular eye movements intact  Mouth -[]  mucous membranes moist, [] pharynx normal without lesions  Neck -[]   supple,  [] no adenopathy, [] no JVD,[] Thyromegaly.  Lymphatics -[]  no palpable lymphadenopathy  Chest - [x] clear to auscultation, [x]  2 L O2,  right-sided chest tube clamped [x] []  rhonchi, [x] symmetric air entry  Heart - [x] normal rate, [] regular rhythm, [] normal S1, S2, []  murmurs,[]  rubs, []   gallops  Abdomen - [x] soft,[x]  nontender,[]  nondistended,[]  no masses[]  organomegaly  Neurological -  [] alert,[]  oriented x3,[]  normal speech,[]  no focal findings or movement disorder noted  Musculoskeletal - []  joint tenderness,[]  deformity [] swelling  Extremities -[]  peripheral pulses normal,[]   pedal edema,[]  clubbing []  cyanosis  Skin - [] normal coloration and turgor, []  rashes,[]   skin lesions noted                Labs:     Results     Procedure Component Value Units Date/Time    Basic Metabolic Panel [[id number]] Collected: 08/09/19 0435    Specimen: Blood Updated: 08/09/19 0554     Glucose 80 mg/dL      BUN 30.1 mg/dL      Creatinine 0.9 mg/dL      Calcium 9.6 mg/dL      Sodium 601 mEq/L      Potassium 4.2 mEq/L      Chloride 104 mEq/L      CO2 24 mEq/L      Anion Gap 10.0    Hemolysis index [[id number]] Collected: 08/09/19 0435     Updated: 08/09/19 0554     Hemolysis Index 17    GFR [[id number]] Collected: 08/09/19 0435     Updated: 08/09/19 6237  EGFR >60.0    CBC and differential [[id number]] Collected: 08/09/19 0435    Specimen: Blood Updated: 08/09/19 0522     WBC 5.16 x10 3/uL      Hgb 13.1 g/dL      Hematocrit 40.9 %      Platelets 150 x10 3/uL      RBC 4.30 x10 6/uL      MCV 92.8 fL      MCH 30.5 pg      MCHC 32.8 g/dL      RDW 12 %      MPV 10.9 fL      Neutrophils 50.0 %      Lymphocytes Automated 39.5 %      Monocytes 6.8 %      Eosinophils Automated 2.7 %      Basophils Automated 0.8 %      Immature Granulocytes 0.2 %      Nucleated RBC 0.0 /100 WBC      Neutrophils Absolute 2.58 x10 3/uL      Lymphocytes Absolute Automated 2.04 x10 3/uL      Monocytes Absolute Automated 0.35 x10 3/uL      Eosinophils Absolute Automated 0.14 x10 3/uL      Basophils Absolute Automated 0.04 x10 3/uL      Immature Granulocytes Absolute 0.01 x10 3/uL      Absolute NRBC 0.00 x10 3/uL           Rads:     Radiology Results (24 Hour)     Procedure Component Value Units Date/Time    XR Chest AP Portable [[id number]] Resulted: 08/09/19 0747    Order Status: Sent Updated: 08/09/19 0747            Assessment/Plan:     Right  pneumothorax: Recurrent, status post chest tube placement will consider removing of the chest tube if tolerating clamping with no recurrence.awaiting fu xeay  Patient with recurrent episode, should be referred to CT surgery for likely a candidate for surgical definitive procedure/pleurodesis.    Signed by: Margarite Gouge, MD

## 2019-08-09 NOTE — Plan of Care (Addendum)
11:55 am MD made aware Xray results. Chest tube was clamped.   16:29 pm Md Chinery made aware Xray results.   Problem: Safety  Goal: Patient will be free from injury during hospitalization  Outcome: Progressing  Goal: Patient will be free from infection during hospitalization  Outcome: Progressing     Problem: Pain  Goal: Pain at adequate level as identified by patient  Outcome: Progressing     Problem: Side Effects from Pain Analgesia  Goal: Patient will experience minimal side effects of analgesic therapy  Outcome: Progressing     Problem: Discharge Barriers  Goal: Patient will be discharged home or other facility with appropriate resources  Outcome: Progressing     Problem: Psychosocial and Spiritual Needs  Goal: Demonstrates ability to cope with hospitalization/illness  Outcome: Progressing     Problem: Compromised Tissue integrity  Goal: Damaged tissue is healing and protected  Outcome: Progressing  Goal: Nutritional status is improving  Outcome: Progressing     Problem: Moderate/High Fall Risk Score >5  Goal: Patient will remain free of falls  Outcome: Progressing     Problem: Compromised Hemodynamic Status  Goal: Vital signs and fluid balance maintained/improved  Outcome: Progressing     Problem: Inadequate Gas Exchange  Goal: Adequate oxygenation and improved ventilation  Outcome: Progressing  Goal: Patent Airway maintained  Outcome: Progressing     Problem: Inadequate Airway Clearance  Goal: Normal respiratory rate/effort achieved/maintained  Outcome: Progressing     Problem: Impaired Mobility  Goal: Mobility/Activity is maintained at optimal level for patient  Outcome: Progressing     Problem: Side Effects from Pain Analgesia  Goal: Patient will experience minimal side effects of analgesic therapy  Outcome: Progressing     Problem: Pain  Goal: Pain at adequate level as identified by patient  Outcome: Progressing

## 2019-08-09 NOTE — Discharge Summary (Signed)
[facility]      Patient: [name]  Admission Date: 08/03/2019   DOB: 1990/10/24  Discharge Date: 08/09/2019    MRN: [id number]  Discharge Attending:Betty Brooks C Nialah Saravia     Referring Physician: Patsy Lager, MD  PCP: Patsy Lager, MD       DISCHARGE SUMMARY     Discharge Information   Admission Diagnosis:   Recurrent spontaneous pneumothorax    Discharge Diagnosis:   Active Hospital Problems    Diagnosis    Recurrent spontaneous pneumothorax   Acute kidney injury resolved  Obesity  Hyperglycemia  Abnormal urine analysis    Admission Condition: Guarded  Discharge Condition: Stable and improved  Consultants: Dr. Lattie Haw pulmonary and critical care medicine Dr. Jackquline Berlin pulmonary critical care medicine  Functional Status: Ambulatory  Discharged to: home     Discharge Medications:     Medication List      ASK your doctor about these medications    docusate sodium 100 MG capsule  Commonly known as: COLACE     hydrocortisone 25 MG suppository  Commonly known as: ANUSOL-HC  Place 1 suppository (25 mg total) rectally 2 (two) times daily.     ibuprofen 200 MG tablet  Commonly known as: ADVIL     oxyCODONE-acetaminophen 7.5-325 MG per tablet  Commonly known as: [facility] Course   Presentation History   [name] is a 29 year old lady who had been here sometime early on in the month of February with a spontaneous pneumothorax  She was treated with chest tube drainage and eventually discharged home  Resented to the emergency room on the day of admission with sided chest pain and shortness of breath    See HPI for details.    Hospital Course (5 Days)   Patient was admitted to the hospital and seen in consultation by Dr. Lattie Haw pulmonary critical care medicine chest tube was placed and she had follow-up serial radiographs  Chest tube was clamped at some point but she continued to have continued pneumothorax  She was continued on chest tube drainage suction to wall  suction for several days and eventually her chest tube was clamped and follow-up radiographs revealed resolution of the pneumothorax  Chest tube was removed and she was discharged home  Pulmonary medicine recommended if pneumothorax recurs she may benefit from referral to interventional pulmonologist for further evaluation           Best Practices   Was the patient admitted with either a CHF Exacerbation or Pneumonia? No      Progress Note/Physical Exam at Discharge     Subjective: no new complaints feels well    Vitals:    08/09/19 0816 08/09/19 0924 08/09/19 1756 08/09/19 1954   BP: 113/69  103/66 106/70   Pulse: (!) 58 77 67 71   Resp: 17  18 21    Temp: 97.3 F (36.3 C)  97.4 F (36.3 C) 98.6 F (37 C)   TempSrc: Oral  Oral Oral   SpO2: 94%  93% 96%   Weight: 105.7 kg (233 lb)      Height:               General: NAD, AAOx3  HEENT: perrla, eomi, sclera anicteric, OP: Clear, MMM  Neck: supple, FROM, no LAD  Cardiovascular: RRR, no m/r/g  Lungs: CTAB, no w/r/r  Abdomen: soft, +BS, NT/ND, no masses, no  g/r  Extremities: no C/C/E  Skin: no rashes or lesions noted  Neuro: CN 2-12 intact; No Focal neurological deficits       Diagnostics     Labs/Studies Pending at Discharge: No  Last Labs   Recent Labs   Lab 08/09/19  0435 08/08/19  0347 08/07/19  0432   WBC 5.16 5.51 4.81   RBC 4.30 3.99 4.12   Hgb 13.1 11.9 12.3   Hematocrit 39.9 37.2 37.9   MCV 92.8 93.2 92.0   Platelets 150 288 267       Recent Labs   Lab 08/09/19  0435 08/08/19  0347 08/07/19  0432 08/06/19  0549 08/05/19  0331   Sodium 138 139 141 141 139   Potassium 4.2 4.2 4.1 4.4 4.1   Chloride 104 106 108 106 106   CO2 24 24 24 26 25    BUN 15.0 9.0 8.0 9.0 10.0   Creatinine 0.9 0.8 0.8 0.9 0.8   Glucose 80 91 97 95 84   Calcium 9.6 9.2 9.1 9.3 9.0       Microbiology Results     Procedure Component Value Units Date/Time    Adult Urine culture [[id number]] Collected: 08/03/19 1341    Specimen: Urine, Clean Catch Updated: 08/04/19 1504    Narrative:      R/O  UTI  ABNORMAL U/A  Replace urinary catheter prior to obtaining the urine culture  if it has been in place for greater than or equal to 14  days:->N/A No Foley  Indications for Urine Culture:->Other (please specify in  Comments)  ORDER#: [id number]                                    ORDERED BY: Rahmon Heigl  SOURCE: Urine, Clean Catch                           COLLECTED:  08/03/19 13:41  ANTIBIOTICS AT COLL.:                                RECEIVED :  08/03/19 17:52  Culture Urine                              FINAL       08/04/19 15:04  08/04/19   1,000 - 9,000 CFU/ML of normal urogenital or skin microbiota, no further             work      COVID-19 [employer]) [place] Rapid) [[id number]] Collected: 08/03/19 0238    Specimen: Nasopharyngeal Swab from Nasopharynx Updated: 08/03/19 0314     Purpose of COVID testing Screening     SARS-CoV-2 Specimen Source Nasopharyngeal     SARS CoV 2 Overall Result Negative     Comment: Test performed using the Abbott ID NOW EUA assay.  Please see Fact Sheets for patients and providers located at:  http://www.rice.biz/  This test is for the qualitative detection of SARS-CoV-2  (COVID19) nucleic acid. Viral nucleic acids may persist in vivo,  independent of viability. Detection of viral nucleic acid does  not imply the presence of infectious virus, or that virus  nucleic acid is the cause of clinical symptoms. Negative  results should be treated as presumptive and, if inconsistent  with clinical signs and symptoms or necessary for patient  management, should be tested with an alternative molecular  assay. Negative results do not preclude SARS-CoV-2 infection  and should not be used as the sole basis for patient  management decisions. Invalid results may be due to inhibiting  substances in the specimen and recollection should occur.         Narrative:      o Collect and clearly label specimen type:  o Upper respiratory specimen: One Nasopharyngeal Dry Swab  NO  Transport Media.  o Hand deliver to laboratory ASAP  Indication for testing->Extended care facility admission to  semi private room  Rescheduled by [id number] at 08/03/2019 02:35 Reason: Printed by   mistake/Printing Issues.           Patient Instructions   Discharge Diet: Regular diet  Discharge Activity: As tolerated  Follow Up Appointment:       Time spent examining patient, discussing with patient/family regarding hospital course, chart review, reconciling medications and discharge planning:   >   35   minutes.    Romero Belling    8:36 PM 08/09/2019

## 2019-08-09 NOTE — Progress Notes (Addendum)
Updated PCP contact:       08/09/19 1221   Family and PCP   PCP on file was verified as the current PCP? Provider not on file   In case you are admitted, would like your PCP notified? No   PCP - first name [place]ilayo   PCP - last name [place]   PCP - city [facility]   PCP - state [place]   PCP - phone  [phone]   PCP - [phone]       LOS # 5      Summary of Discharge Plan: return home w/ family, no CM needs anticipated at this time.      Identified Possible Discharge Barriers: chest tube in placed, clamped today      CM Interventions and Outcome: reviewed record. CM received voicemail from pt re: updated PCP contact. CM updated PCP info in Epic      Discussed above Discharge Plan with (patient, family, Care Team, others): MD, family      Case Management will continue to following on patient's discharge needs.    Madie Reno, RN  Manager, Case Management  [phone]

## 2019-08-09 NOTE — UM Notes (Signed)
CSR 08/09/19    MD PROG NOTE:  29 y.o. 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 Gean Quint will consider pleurodesis if she develops third episode of pneumothorax  If pneumothorax does not resolve we will consider evaluation by interventional pulmonologist at [facility]  Plan to follow-up chest x-ray tomorrow and decide on disposition      Acute kidney injury  Most likely due to volume depletion  Resolved with IV fluid hydration   DISPO: To be determined  BP: 112/72   Pulse: 62   Resp: 17   Temp: 97.9 F (36.6 C)   SpO2: 100%     3/1 CXR:       Impression:     No discernible pneumothorax. Stable, right lower lung   platelike atelectasis.

## 2019-08-10 ENCOUNTER — Inpatient Hospital Stay: Payer: Medicaid Other

## 2019-08-10 DIAGNOSIS — R079 Chest pain, unspecified: Secondary | ICD-10-CM | POA: Diagnosis present

## 2019-08-10 DIAGNOSIS — N179 Acute kidney failure, unspecified: Secondary | ICD-10-CM | POA: Diagnosis present

## 2019-08-10 LAB — BASIC METABOLIC PANEL
Anion Gap: 11 (ref 5.0–15.0)
BUN: 15 mg/dL (ref 7.0–19.0)
CO2: 24 mEq/L (ref 22–29)
Calcium: 9.4 mg/dL (ref 8.5–10.5)
Chloride: 104 mEq/L (ref 100–111)
Creatinine: 0.8 mg/dL (ref 0.6–1.0)
Glucose: 89 mg/dL (ref 70–100)
Potassium: 4.1 mEq/L (ref 3.5–5.1)
Sodium: 139 mEq/L (ref 136–145)

## 2019-08-10 LAB — CBC AND DIFFERENTIAL
Absolute NRBC: 0 10*3/uL (ref 0.00–0.00)
Basophils Absolute Automated: 0.06 10*3/uL (ref 0.00–0.08)
Basophils Automated: 1 %
Eosinophils Absolute Automated: 0.2 10*3/uL (ref 0.00–0.44)
Eosinophils Automated: 3.4 %
Hematocrit: 39.3 % (ref 34.7–43.7)
Hgb: 12.7 g/dL (ref 11.4–14.8)
Immature Granulocytes Absolute: 0.02 10*3/uL (ref 0.00–0.07)
Immature Granulocytes: 0.3 %
Lymphocytes Absolute Automated: 2.32 10*3/uL (ref 0.42–3.22)
Lymphocytes Automated: 39.4 %
MCH: 29.6 pg (ref 25.1–33.5)
MCHC: 32.3 g/dL (ref 31.5–35.8)
MCV: 91.6 fL (ref 78.0–96.0)
MPV: 9 fL (ref 8.9–12.5)
Monocytes Absolute Automated: 0.35 10*3/uL (ref 0.21–0.85)
Monocytes: 5.9 %
Neutrophils Absolute: 2.94 10*3/uL (ref 1.10–6.33)
Neutrophils: 50 %
Nucleated RBC: 0 /100 WBC (ref 0.0–0.0)
Platelets: 266 10*3/uL (ref 142–346)
RBC: 4.29 10*6/uL (ref 3.90–5.10)
RDW: 12 % (ref 11–15)
WBC: 5.89 10*3/uL (ref 3.10–9.50)

## 2019-08-10 LAB — HEMOLYSIS INDEX: Hemolysis Index: 3 (ref 0–18)

## 2019-08-10 LAB — ANA SCREEN REFLEX: ANA Screen Reflex: NEGATIVE

## 2019-08-10 LAB — GFR: EGFR: >60

## 2019-08-10 NOTE — Progress Notes (Signed)
Pt is now discharged home,accompanied by family member. Seen by Pulmonologist prior [place] instructions reviewed with emphasis on restricted activities ie no strenuous activity and exercise except walking for about 2-3 weeks per Pulmonologist and if symptoms develop to go ER as soon as possible or call 911. Pt is also aware she has to follow up with Dr. Romie Jumper in 2 weeks,pt's verba;lized understanding.vswnl,afebrile, no distress on RA,iv removed,catheter intact,monitor removed prior [place].Pt forgott her [place] papers,called family member and pt call back, aware that she left her paper and will pick it up tomorrow.Envelop with [place] papers left at desk with pt's name and phone number to reach.

## 2019-08-10 NOTE — Progress Notes (Signed)
Seen at bedside, thoracic vent evaluated, clamped, no evidence of leak via the thoracic vent valve, chest tube removed.  Will do follow-up x-ray 4 hours later.

## 2019-08-10 NOTE — UM Notes (Signed)
[facility] Utilization Review   NPI #[id number], Tax ID [id number]  Please call Fuller Plan MSN RN CCM @ [phone] with any questions or concerns.  Email:  [email or web address]  Fax final authorization and requests for additional information to [phone]    CONCURRENT REVIEW FOR: 08/10/19    LOC: IMCU    PATIENT NAME: [name],[name] / Aug 02, 1990 / AGE: 29 y.o.        V/S:  98-68-14-105/65-96%RA     Radiologic Studies -   Xr Chest Ap Portable    Result Date: 08/10/2019      NO ACUTE ABNORMALITY. NO EVIDENCE FOR PNEUMOTHORAX. Darnelle Maffucci, MD  08/10/2019 3:16 PM    Xr Chest Ap Portable    Result Date: 08/10/2019   No evidence for significant residual right pneumothorax. Sandie Ano, MD  08/10/2019 9:55 AM            MD NOTES:  PULMONARY NOTES  Subjective:   Doing fair, mild right sided chest pain but resolved mainly with deep breathing chest x-ray no pneumothorax post clamping and this a.m.  Assessment/Plan:   Recurrent right pneumothorax: Second episode, status post chest tube placement tolerated chest tube clamping with no recurrence on waterseal overnight with follow-up x-ray showing no residual pneumo will remove chest tube today.  If recurrent episode patient will need to be referred to CT surgery for definitive treatment/pleurodesis    ADDENDUM:  Seen at bedside, thoracic vent evaluated, clamped, no evidence of leak via the thoracic vent valve, chest tube removed.  Will do follow-up x-ray 4 hours later    DIET:PO    Current Medications:   Scheduled Meds:  Current Facility-Administered Medications   Medication Dose Route Frequency    senna-docusate  1 tablet Oral QHS     Continuous Infusions:  PRN Meds:.acetaminophen **OR** acetaminophen, midazolam, naloxone, ondansetron **OR** ondansetron, oxyCODONE

## 2019-08-10 NOTE — Discharge Summary (Signed)
[facility]      Patient: [name]  Admission Date: 08/03/2019   DOB: 07-10-1990  Discharge Date: 08/10/2019    MRN: [id number]  Discharge Attending:Katianna Mcclenney Margit Hanks     Referring Physician: Patsy Lager, MD  PCP: Patsy Lager, MD       DISCHARGE SUMMARY     Discharge Information   Admission Diagnosis:   Recurrent spontaneous pneumothorax    Discharge Diagnosis:   Active Hospital Problems    Diagnosis    Right-sided chest pain    AKI (acute kidney injury)    Recurrent spontaneous pneumothorax        Admission Condition: Guarded  Discharge Condition: Stable  Consultants: Pulmonology/intensivist  Functional Status: Able to ambulate without any assistance  Discharged to: Home with supervision    Discharge Medications:     Medication List      CONTINUE taking these medications    docusate sodium 100 MG capsule  Commonly known as: COLACE     hydrocortisone 25 MG suppository  Commonly known as: ANUSOL-HC  Place 1 suppository (25 mg total) rectally 2 (two) times daily.     ibuprofen 200 MG tablet  Commonly known as: ADVIL     oxyCODONE-acetaminophen 7.5-325 MG per tablet  Commonly known as: [facility] Course   Presentation History   As per admitting provider, "29 y.o. female with medical history of obesity and recent hospitalization (07/20/2019-02/11/20210) for right spontaneous pneumothorax required chest tube who presented with chest pain and shortness of breath.      Patient was somnolent during my evaluation likely due to sedation giving for chest tube insertion in ED (patient received Versed and morphine).  Patient reports that she is having mild right-sided chest pain associated with shortness of breath since this morning.  Patient denies fall, trauma, excessive cough, clotting disorder, COPD, cigarette smoking or using illicit drugs.  Patient denies fever, chills, change in urinary/bowel habit.  Patient was recently admitted for right spontaneous pneumothorax required  chest tube.  In ED, found recurrent right side pneumothorax, chest tube placed in ED."    See HPI for details.    Hospital Course (6 Days)     Patient was admitted to the hospital and seen in consultation by Dr. Lattie Haw pulmonary critical care medicine chest tube was placed and she had follow-up serial radiographs  Chest tube was clamped at some point but she continued to have continued pneumothorax  She was continued on chest tube drainage suction to wall suction for several days and eventually her chest tube was clamped and follow-up radiographs revealed resolution of the pneumothorax  Chest tube was removed and she was discharged home    Patient to follow-up with pulmonology as an outpatient  Patient also instructed to follow-up with CT surgery  if her pneumothorax recurs    Procedures/Imaging:   Xr Chest 2 Views    Result Date: 08/02/2019  Marked interval enlargement of the right pneumothorax which is now large. Aldean Ast, MD  08/02/2019 10:39 PM    Ct Chest Wo Contrast    Result Date: 07/20/2019  1.  Small residual right pneumothorax. 2.  Atelectasis versus infiltrate in the dependent portion of the lungs. Tana Felts, MD  07/20/2019 7:06 PM    Xr Chest Ap Portable    Result Date: 08/10/2019      NO ACUTE ABNORMALITY. NO EVIDENCE FOR PNEUMOTHORAX. Darnelle Maffucci, MD  08/10/2019 3:16 PM    Xr Chest Ap Portable    Result Date: 08/10/2019   No evidence for significant residual right pneumothorax. Sandie Ano, MD  08/10/2019 9:55 AM    Xr Chest Ap Portable    Result Date: 08/09/2019   No discernible pneumothorax. Stable, right lower lung airspace disease most likely platelike atelectasis. No change from XR CHEST AP PORTABLE with report dated 08/09/2019 11:39 AM. [name], MD  08/09/2019 3:25 PM    Xr Chest Ap Portable    Result Date: 08/09/2019   No discernible pneumothorax. Stable, right lower lung platelike atelectasis. Georgana Curio, MD  08/09/2019 11:39 AM    Xr Chest Ap Portable    Result Date: 08/08/2019   Stable position of  right chest tube. No appreciable pneumothorax. Drue Dun, MD  08/08/2019 10:55 AM    Xr Chest Ap Portable    Result Date: 08/07/2019  1. Small right apical pneumothorax is notably decreased in size. 2. New left basilar subsegmental atelectasis and/or infiltrate. Gustavus Messing, MD  08/07/2019 10:16 AM    Xr Chest Ap Portable    Result Date: 08/06/2019   Enlarging right pneumothorax [name], MD  08/06/2019 9:24 AM    Xr Chest Ap Portable    Result Date: 08/05/2019  Slight enlargement of a small right apical pneumothorax. Unchanged position of chest tube. [name], DO  08/05/2019 7:44 PM    Xr Chest Ap Portable    Result Date: 08/05/2019   Previously seen right apical pneumothorax is not currently well seen by plain film [name], MD  08/05/2019 11:36 AM    Xr Chest Ap Portable    Result Date: 08/04/2019   Minimal right apical pneumothorax without acute complication or change compared to February 23. Chest tube in position. Charlene Brooke, MD  08/04/2019 10:38 AM    Xr Chest Ap Portable    Result Date: 08/03/2019   The cardiomediastinal silhouette has not shown significant interval change allowing for differences in patient positioning. Right pleural catheter does not appear significantly changed. There has been interval decrease in right pneumothorax with small residual right apical pneumothorax. Nodular density projecting over the left upper lobe may be artifactual. The lungs otherwise appear clear. There is no evidence for pleural effusion. Sandie Ano, MD  08/03/2019 7:05 PM    Xr Chest Ap Portable    Result Date: 08/03/2019   The cardiomediastinal silhouette has not shown significant interval change allowing for differences in patient positioning. Right pleural catheter does not appear significantly changed. Previously identified small right pneumothorax has shown mild interval increase in size. Linear opacity projecting over the right lung base does not appear significantly changed. The left lung appears  clear. There is no evidence for pneumothorax on the left. Sandie Ano, MD  08/03/2019 4:16 PM    Xr Chest Ap Portable    Result Date: 08/03/2019  Small apical pneumothorax in the setting of a right-sided chest tube. Aldean Ast, MD  08/03/2019 8:35 AM    Chest Ap Portable    Result Date: 08/03/2019  Resolved pneumothorax status post right chest tube placement. Adaline Sill, MD  08/03/2019 1:42 AM    Xr Chest Ap Portable    Result Date: 07/22/2019   Stable right apical pneumothorax following chest tube removal. No acute infiltrate. Kinnie Feil, MD  07/22/2019 1:25 PM    Xr Chest Ap Portable    Result Date: 07/22/2019  1. Small residual right apical pneumothorax, unchanged from comparison  allowing for differences in technique. 2. Stable positioning of right thoracostomy tube.  Casimiro Needle [facility]  07/22/2019 8:23 AM    Xr Chest Ap Portable    Result Date: 07/21/2019   Small residual right apical pneumothorax. Left lower lobe retrocardiac infiltrate versus subsegmental atelectasis. Heron Nay, MD  07/21/2019 3:10 PM    Xr Chest  Ap Portable    Result Date: 07/20/2019   Status post right chest tube placement with resolution of right-sided pneumothorax. Mitali Bapna, MD  07/20/2019 4:04 PM    Chest Ap Portable    Result Date: 07/20/2019   Moderate large right pneumothorax. These results were given to PA Reck on 07/20/2019 at 2:15 PM. Merri Ray, MD  07/20/2019 2:18 PM         Best Practices   Was the patient admitted with either a CHF Exacerbation or Pneumonia?  No     Progress Note/Physical Exam at Discharge     Subjective: Today patient reports that she feels great denies any fever chills nausea vomiting or chest pain shortness of breath    Vitals:    08/09/19 2320 08/10/19 0354 08/10/19 0908 08/10/19 1613   BP: 111/71 115/75 105/65 115/76   Pulse: 62 70 68 80   Resp: 15 17 14 14    Temp: 98.1 F (36.7 C) 97.7 F (36.5 C) 98 F (36.7 C) 98.2 F (36.8 C)   TempSrc: Oral Oral Oral Oral   SpO2: 97% 99% 96% 98%   Weight:  104.6 kg (230  lb 8.2 oz)     Height:               General: NAD, AAOx3  HEENT: perrla, eomi, sclera anicteric, OP: Clear, MMM  Neck: supple, FROM, no LAD  Cardiovascular: RRR, no m/r/g  Lungs: CTAB, no w/r/r  Abdomen: soft, +BS, NT/ND, no masses, no g/r  Extremities: no C/C/E  Skin: no rashes or lesions noted  Neuro: CN 2-12 intact; No Focal neurological deficits       Diagnostics     Labs/Studies Pending at Discharge: No    Last Labs   Recent Labs   Lab 08/10/19  0357 08/09/19  0435 08/08/19  0347   WBC 5.89 5.16 5.51   RBC 4.29 4.30 3.99   Hgb 12.7 13.1 11.9   Hematocrit 39.3 39.9 37.2   MCV 91.6 92.8 93.2   Platelets 266 150 288       Recent Labs   Lab 08/10/19  0357 08/09/19  0435 08/08/19  0347 08/07/19  0432 08/06/19  0549   Sodium 139 138 139 141 141   Potassium 4.1 4.2 4.2 4.1 4.4   Chloride 104 104 106 108 106   CO2 24 24 24 24 26    BUN 15.0 15.0 9.0 8.0 9.0   Creatinine 0.8 0.9 0.8 0.8 0.9   Glucose 89 80 91 97 95   Calcium 9.4 9.6 9.2 9.1 9.3       Microbiology Results     Procedure Component Value Units Date/Time    Adult Urine culture [[id number]] Collected: 08/03/19 1341    Specimen: Urine, Clean Catch Updated: 08/04/19 1504    Narrative:      R/O UTI  ABNORMAL U/A  Replace urinary catheter prior to obtaining the urine culture  if it has been in place for greater than or equal to 14  days:->N/A No Foley  Indications for Urine Culture:->Other (please specify in  Comments)  ORDER#: [id number]  ORDERED BY: CHINERY, ROGER  SOURCE: Urine, Clean Catch                           COLLECTED:  08/03/19 13:41  ANTIBIOTICS AT COLL.:                                RECEIVED :  08/03/19 17:52  Culture Urine                              FINAL       08/04/19 15:04  08/04/19   1,000 - 9,000 CFU/ML of normal urogenital or skin microbiota, no further             work      COVID-19 [employer]) [facility] Rapid) [[id number]] Collected: 08/03/19 0238    Specimen: Nasopharyngeal Swab from Nasopharynx Updated:  08/03/19 0314     Purpose of COVID testing Screening     SARS-CoV-2 Specimen Source Nasopharyngeal     SARS CoV 2 Overall Result Negative     Comment: Test performed using the Abbott ID NOW EUA assay.  Please see Fact Sheets for patients and providers located at:  http://www.rice.biz/  This test is for the qualitative detection of SARS-CoV-2  (COVID19) nucleic acid. Viral nucleic acids may persist in vivo,  independent of viability. Detection of viral nucleic acid does  not imply the presence of infectious virus, or that virus  nucleic acid is the cause of clinical symptoms. Negative  results should be treated as presumptive and, if inconsistent  with clinical signs and symptoms or necessary for patient  management, should be tested with an alternative molecular  assay. Negative results do not preclude SARS-CoV-2 infection  and should not be used as the sole basis for patient  management decisions. Invalid results may be due to inhibiting  substances in the specimen and recollection should occur.         Narrative:      o Collect and clearly label specimen type:  o Upper respiratory specimen: One Nasopharyngeal Dry Swab NO  Transport Media.  o Hand deliver to laboratory ASAP  Indication for testing->Extended care facility admission to  semi private room  Rescheduled by [id number] at 08/03/2019 02:35 Reason: Printed by   mistake/Printing Issues.           Patient Instructions   Discharge Diet: Regular  Discharge Activity: As tolerated    Follow Up Appointment:  Follow-up Information     Herscowitz, Barbette Hair, MD. Schedule an appointment as soon as possible for a visit in 1 week(s).    Specialty: Pulmonary Disease  Contact information:  [address]  [place] [address]  [phone]             Patsy Lager, MD .                  Time spent examining patient, discussing with patient/family regarding hospital course, chart review, reconciling medications and discharge planning: > 45  minutes.    Lowanda Foster    8:11 PM 08/10/2019

## 2019-08-10 NOTE — Progress Notes (Signed)
PROGRESS NOTE    Date Time: 08/10/19 10:59 AM  Patient Name: [name],[name]      Subjective:   Doing fair, mild right sided chest pain but resolved mainly with deep breathing chest x-ray no pneumothorax post clamping and this a.m.    Medications:      Scheduled Meds: PRN Meds:    senna-docusate, 1 tablet, Oral, QHS          Continuous Infusions:   acetaminophen, 650 mg, Q6H PRN    Or  acetaminophen, 650 mg, Q6H PRN  midazolam, 5 mg, Once PRN  naloxone, 0.2 mg, PRN  ondansetron, 4 mg, Q6H PRN    Or  ondansetron, 4 mg, Q6H PRN  oxyCODONE, 5 mg, Q4H PRN            Review of Systems:   A comprehensive review of systems was: General ROS:[x]    negative other than :  Respiratory ROS:[]  cough,[] shortness of breath,  []  wheezing  Cardiovascular ROS:  [x]  Mild chest pain []  dyspnea on exertion  Gastrointestinal ROS: []  abdominal pain, [] change in bowel habits,  [] black/ bloody stools  Genito-Urinary ROS: []  dysuria,[]  trouble voiding,[]  hematuria  Musculoskeletal ROS:[]  negative  Neurological ROS:[]  negative    Physical Exam:     Vitals:    08/10/19 0908   BP: 105/65   Pulse: 68   Resp: 14   Temp: 98 F (36.7 C)   SpO2: 96%       Intake and Output Summary (Last 24 hours) at Date Time    Intake/Output Summary (Last 24 hours) at 08/10/2019 1059  Last data filed at 08/10/2019 0354  Gross per 24 hour   Intake 370 ml   Output 0 ml   Net 370 ml       General appearance [x]  alert, [x] well appearing,  [] no distress  Eyes -[]  pupils equal and[] reactive, -[]   extraocular eye movements intact  Mouth -[]  mucous membranes moist, [] pharynx normal without lesions  Neck -[]   supple,  [] no adenopathy, [] no JVD,[] Thyromegaly.  Lymphatics -[]  no palpable lymphadenopathy  Chest - [x] clear to auscultation, []  wheezes, [] rales[]  rhonchi, [] symmetric air entry  Heart - [x] normal rate, [] regular rhythm, [] normal S1, S2, []  murmurs,[]  rubs, []   gallops  Abdomen - [x] soft,[x]  nontender,[]  nondistended,[]  no masses[]  organomegaly  Neurological -  [x] alert,[]  oriented x3,[]  normal speech,[]  no focal findings or movement disorder noted  Musculoskeletal - []  joint tenderness,[]  deformity [] swelling  Extremities -[]  peripheral pulses normal,[]   pedal edema,[]  clubbing []  cyanosis  Skin - [] normal coloration and turgor, []  rashes,[]   skin lesions noted                Labs:     Results     Procedure Component Value Units Date/Time    ANA SCREEN REFLEX [[id number]] Collected: 08/10/19 0357     Updated: 08/10/19 0719    GFR [[id number]] Collected: 08/10/19 0357     Updated: 08/10/19 0428     EGFR >60.0    Basic Metabolic Panel [[id number]] Collected: 08/10/19 0357    Specimen: Blood Updated: 08/10/19 0428     Glucose 89 mg/dL      BUN 36.6 mg/dL      Creatinine 0.8 mg/dL      Calcium 9.4 mg/dL      Sodium 440 mEq/L      Potassium 4.1 mEq/L      Chloride 104 mEq/L      CO2 24 mEq/L      Anion Gap 11.0  Hemolysis index [[id number]] Collected: 08/10/19 0357     Updated: 08/10/19 0428     Hemolysis Index 3    CBC and differential [[id number]] Collected: 08/10/19 0357    Specimen: Blood Updated: 08/10/19 0410     WBC 5.89 x10 3/uL      Hgb 12.7 g/dL      Hematocrit 62.8 %      Platelets 266 x10 3/uL      RBC 4.29 x10 6/uL      MCV 91.6 fL      MCH 29.6 pg      MCHC 32.3 g/dL      RDW 12 %      MPV 9.0 fL      Neutrophils 50.0 %      Lymphocytes Automated 39.4 %      Monocytes 5.9 %      Eosinophils Automated 3.4 %      Basophils Automated 1.0 %      Immature Granulocytes 0.3 %      Nucleated RBC 0.0 /100 WBC      Neutrophils Absolute 2.94 x10 3/uL      Lymphocytes Absolute Automated 2.32 x10 3/uL      Monocytes Absolute Automated 0.35 x10 3/uL      Eosinophils Absolute Automated 0.20 x10 3/uL      Basophils Absolute Automated 0.06 x10 3/uL      Immature Granulocytes Absolute 0.02 x10 3/uL      Absolute NRBC 0.00 x10 3/uL           Rads:     Radiology Results (24 Hour)     Procedure Component Value Units Date/Time    XR Chest AP Portable [[id number]] Collected: 08/10/19 0954     Order Status: Completed Updated: 08/10/19 0957    Narrative:      XR CHEST AP PORTABLE    CLINICAL INDICATION:   ptx    COMPARISON: 08/09/2019    FINDINGS: The cardiomediastinal silhouette appears within normal size  limits for portable technique and patient positioning. Right pleural  catheter does not appear significantly changed. There is no evidence for  focal airspace consolidation, pleural effusion, or pneumothorax. The  pulmonary vascularity appears unremarkable.      Impression:       No evidence for significant residual right pneumothorax.    Sandie Ano, MD   08/10/2019 9:55 AM    XR Chest AP Portable [[id number]] Collected: 08/09/19 1524    Order Status: Completed Updated: 08/09/19 1528    Narrative:      HISTORY: Follow-up pneumothorax    TECHNIQUE: Single frontal view of the chest was obtained.         FINDINGS: Right upper lung chest tube is stable in position. No  discernible pneumothorax. There is nonspecific stable, right basilar  density most consistent with platelike atelectasis. The lung fields are  clear. There are no significant pleural effusions. The cardiac  silhouette and hila are normal. The trachea is midline. The bony  structures are essentially unremarkable.       Impression:       No discernible pneumothorax. Stable, right lower lung  airspace disease most likely platelike atelectasis.        No change from XR CHEST AP PORTABLE with report dated 08/09/2019 11:39 AM.    [name], MD   08/09/2019 3:25 PM    XR Chest AP Portable [[id number]] Collected: 08/09/19 1138    Order Status: Completed Updated: 08/09/19 1141  Narrative:      HISTORY: Follow-up pneumothorax    TECHNIQUE: Single frontal view of the chest was obtained.     PRIORS: 08/08/2019.    FINDINGS: Right upper lung chest tube is stable in position. No  discernible pneumothorax. There is stable, linear right basilar density  consistent with platelike atelectasis. The lung fields are clear. There  are no significant pleural  effusions. The cardiac silhouette and hila  are normal. The trachea is midline. The bony structures are essentially  unremarkable.       Impression:       No discernible pneumothorax. Stable, right lower lung  platelike atelectasis.    Georgana Curio, MD   08/09/2019 11:39 AM            Assessment/Plan:     Recurrent right pneumothorax:  Second episode, status post chest tube placement tolerated chest tube clamping with no recurrence on waterseal overnight with follow-up x-ray showing no residual pneumo will remove chest tube today.  If recurrent episode patient will need to be referred to CT surgery for definitive treatment/pleurodesis  Signed by: Margarite Gouge, MD

## 2019-08-10 NOTE — Progress Notes (Signed)
Medically cleared and agreeable for [facility] today. Family to transport  No CM needs at time of [place]. PCP has been updated in Epic       08/10/19 1737   Discharge Disposition   Patient preference/choice provided? Yes   Physical Discharge Disposition Home   Mode of Transportation Car   Patient/Family/POA notified of transfer plan Patient informed only   Patient agreeable to discharge plan/expected d/c date? Yes   Bedside nurse notified of transport plan? Yes   CM Interventions   Follow up appointment scheduled? No  (pt to arrange)   Referral made for home health RN visit? Does not meet home bound criteria   Multidisciplinary rounds/family meeting before d/c? Yes   Medicare Checklist   Is this a Medicare patient? No   Patient received 1st IMM Letter? n/a     Madie Reno, RN  Manager, Case Management  [phone]

## 2019-08-10 NOTE — Discharge Instr - AVS First Page (Addendum)
[facility] DISCHARGE INSTRUCTIONS     Date of Admission: 08/03/2019    Date of Discharge: 08/10/2019    Discharge Physician: Dorian Heckle    Dear [name],     Thank you for choosing [facility] for your emergency care needs. We strive to provide EXCELLENT care to you and your family.     In an effort to explain clearly why you were here in the hospital, I've written a very brief summary. I hope that you find it useful. Other details including formal diagnosis, medication changes, follow up appointment recommendations, and access to MyChart for formal medical records can be found in this packet.       You were admitted for Recurrent spontaneous pneumothorax . Make sure to follow up with your primary care doctor . I cannot stress the importance of follow up enough.    Make sure to bring the following to your doctors appointments:  Medications in their original bottles  Glucometer/blood sugar log (if diabetic)   Weight log (if you have heart failure)    If you are unable to obtain an appointment, unable to obtain newly prescribed medications, or are unclear about any of your discharge instructions please contact me at [phone] (M-F, 8am-3pm) or weekends and after hours via the hospital operator [phone]) [phone], the hospital case manager, or your primary care physician.    Finally, as your discharging physician, you may be receiving a survey which is regarding my care. I would greatly value and appreciate your feedback as I strive for excellence.     Respectfully yours,    Dorian Heckle

## 2019-08-10 NOTE — Progress Notes (Signed)
Dr.Alkhori came see pt and removed the Th[place]a vent,pt tolerated well,Will repeat CXR in 4 hours,DCP home when stable.

## 2019-08-10 NOTE — Plan of Care (Addendum)
VSS all night. CT unclamped and on water seal since 9pm.. Currently no complaints.     Problem: Pain  Goal: Pain at adequate level as identified by patient  Outcome: Progressing  Flowsheets (Taken 08/08/2019 0453 by Midge Aver, RN)  Pain at adequate level as identified by patient:   Identify patient comfort function goal   Assess for risk of opioid induced respiratory depression, including snoring/sleep apnea. Alert healthcare team of risk factors identified.   Reassess pain within 30-60 minutes of any procedure/intervention, per Pain Assessment, Intervention, Reassessment (AIR) Cycle   Evaluate if patient comfort function goal is met   Evaluate patient's satisfaction with pain management progress   Consult/collaborate with Pain Service     Problem: Inadequate Gas Exchange  Goal: Adequate oxygenation and improved ventilation  Outcome: Progressing  Flowsheets (Taken 08/07/2019 1135 by Sharlynn Oliphant, RN)  Adequate oxygenation and improved ventilation:   Assess lung sounds   Monitor SpO2 and treat as needed   Provide mechanical and oxygen support to facilitate gas exchange   Position for maximum ventilatory efficiency   Teach/reinforce use of incentive spirometer 10 times per hour while awake, cough and deep breath as needed   Plan activities to conserve energy: plan rest periods   Increase activity as tolerated/progressive mobility  Goal: Patent Airway maintained  Outcome: Progressing     Problem: Inadequate Airway Clearance  Goal: Normal respiratory rate/effort achieved/maintained  Outcome: Progressing  Flowsheets (Taken 08/07/2019 1135 by Sharlynn Oliphant, RN)  Normal respiratory rate/effort achieved/maintained: Plan activities to conserve energy: plan rest periods     Problem: Impaired Mobility  Goal: Mobility/Activity is maintained at optimal level for patient  Outcome: Progressing  Flowsheets (Taken 08/05/2019 1745 by Sharlynn Oliphant, RN)  Mobility/activity is maintained at optimal level for patient:    Increase mobility as tolerated/progressive mobility   Encourage independent activity per ability   Maintain proper body alignment   Perform active/passive ROM   Plan activities to conserve energy, plan rest periods   Reposition patient every 2 hours and as needed unless able to reposition self   Assess for changes in respiratory status, level of consciousness and/or development of fatigue

## 2019-08-10 NOTE — Progress Notes (Signed)
Chest x-ray reviewed done at 3 PM no acute abnormality no pneumothorax patient clinically doing well ambulating to the bathroom without any difficulty she was informed about no strenuous activity and exercise for 2 to 3weeks but she can ambulate and have a regular activity without any limitation if recurrent or symptoms she will need to report to the emergency room soon as possible I also spoke with her attending physician here at the hospital like to baeck and recommendation was follow-up pulmonary outpatient in couple weeks and consider CT surgery input as an outpatient less likely that patient will be a candidate for pleurodesis at this time but definitely if any recurrent episode 1 more time she will need a definitive treatment.  All question answered.

## 2019-08-11 NOTE — UM Notes (Signed)
[facility] Utilization Review   NPI #[id number], Tax ID [id number]  Please call Fuller Plan MSN RN CCM @ [phone] with any questions or concerns.  Email:  [name]  Fax final authorization and requests for additional information to [phone]    CONCURRENT REVIEW FOR: 2/27-3/1    LOC: IMCU    PATIENT NAME: [name],[name] / Jul 24, 1990 / AGE: 29 y.o.      S/I:  2/27: CHEST TUBE WITH ONGOING AIR LEAK; APICAL PNEUMOTHORAX   2/28: C/O PAIN; REPEAT CHEST XRAY; PREPARE FOR CHEST TUBE CLAMPING TRIAL IN 24 HRS  3/1: CHEST TUBE CLAMPING TRIAL -MONITOR  3/2: D/C HOME         V/S:    Vitals:    08/07/19 0829   BP: 116/72   Pulse: 76   Resp: 20   Temp: 97.8 F (36.6 C)   SpO2:           Temperature: Temp  Min: 97.8 F (36.6 C)  Max: 97.9 F (36.6 C)  Pulse: Pulse  Min: 67  Max: 86  Respiratory: Resp  Min: 17  Max: 20  Non-Invasive BP: BP  Min: 109/60  Max: 119/73  Pulse Oximetry SpO2  Min: 97 %  Max: 98 %      Vitals:    08/08/19 1609   BP: 112/72   Pulse: 62   Resp: 17   Temp: 97.9 F (36.6 C)   SpO2: 100%          Temperature: Temp  Min: 97.6 F (36.4 C)  Max: 98 F (36.7 C)  Pulse: Pulse  Min: 60  Max: 91  Respiratory: Resp  Min: 17  Max: 19  Non-Invasive BP: BP  Min: 111/65  Max: 122/79  Pulse Oximetry SpO2  Min: 96 %  Max: 100 %    08/09/19 1954 -- 98.6 F (37 C) Oral 71 96 % -- 21 106/70         Radiologic Studies -   Xr Chest Ap Portable    Result Date: 08/10/2019      NO ACUTE ABNORMALITY. NO EVIDENCE FOR PNEUMOTHORAX. Darnelle Maffucci, MD  08/10/2019 3:16 PM    Xr Chest Ap Portable    Result Date: 08/10/2019   No evidence for significant residual right pneumothorax. Sandie Ano, MD  08/10/2019 9:55 AM    Xr Chest Ap Portable    Result Date: 08/09/2019   No discernible pneumothorax. Stable, right lower lung airspace disease most likely platelike atelectasis. No change from XR CHEST AP PORTABLE with report dated 08/09/2019 11:39 AM. [name], MD  08/09/2019 3:25 PM    Xr  Chest Ap Portable    Result Date: 08/09/2019   No discernible pneumothorax. Stable, right lower lung platelike atelectasis. Georgana Curio, MD  08/09/2019 11:39 AM    Xr Chest Ap Portable    Result Date: 08/08/2019   Stable position of right chest tube. No appreciable pneumothorax. Drue Dun, MD  08/08/2019 10:55 AM    Xr Chest Ap Portable    Result Date: 08/07/2019  1. Small right apical pneumothorax is notably decreased in size. 2. New left basilar subsegmental atelectasis and/or infiltrate. 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  AKI-likely secondary to volume depletion which is now significantly resolved    Chest tube in  place intact  Ongoing air leak  Chest x-ray shows small right apical pneumothorax which is significantly decreased in size with some new left subsegmental atelectasis  Encourage incentive spirometry as tolerated  If she has recurrence of pneumothorax, might need pleurodesis  Chest x-ray tomorrow    MEDICINE NOTES 2/28  SUBJECTIVE:  complains of pain at the site of a chest tube  A&P  Patient Active Hospital Problem List:  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 Gean Quint will consider pleurodesis if she develops third episode of pneumothorax  If pneumothorax does not resolve we will consider evaluation by interventional pulmonologist at [facility]  Plan to follow-up chest x-ray tomorrow and decide on disposition      Acute kidney injury  Most likely due to volume depletion  Resolved with IV fluid  hydration    Obesity  Diet exercise weight loss and lifestyle modification    Hyperglycemia  Hemoglobin A1c    Rule out UTI  Urine cultures are sterile  Discontinue ceftriaxone      Analgesia: Morphine,  oxycodone and Tylenol    Nutrition: Regular diet    DVT Prophylaxis: SCDs      PULMONARY NOTES 3/1  Assessment/Plan:     Right pneumothorax: Recurrent, status post chest tube placement will consider removing of the chest tube if tolerating clamping with no recurrence.awaiting fu xray  Patient with recurrent episode, should be referred to CT surgery for likely a candidate for surgical definitive procedure/pleurodesis.       Medications:   Scheduled Meds: PRN Meds:    senna-docusate, 1 tablet, Oral, QHS          Continuous Infusions:   acetaminophen, 650 mg, Q6H PRN    Or  acetaminophen, 650 mg, Q6H PRN  midazolam, 5 mg, Once PRN  naloxone, 0.2 mg, PRN  ondansetron, 4 mg, Q6H PRN    Or  ondansetron, 4 mg, Q6H PRN  oxyCODONE, 5 mg, Q4H PRN       2/27  TYLENOL 650 MG PO--X3  OXYCODONE 5 MG PO--X3    2/28  TYLENOL 650 MG PO--X3  OXYCODONE 5 MG PO--X2    3/01  TYLENOL 650 MG PO--X2  OXYCODONE 5 MG PO--X2

## 2020-12-16 ENCOUNTER — Other Ambulatory Visit: Payer: Self-pay

## 2020-12-16 ENCOUNTER — Encounter (HOSPITAL_COMMUNITY): Payer: Self-pay | Admitting: Emergency Medicine

## 2020-12-16 ENCOUNTER — Emergency Department (HOSPITAL_COMMUNITY): Payer: Self-pay

## 2020-12-16 ENCOUNTER — Emergency Department (HOSPITAL_COMMUNITY)
Admission: EM | Admit: 2020-12-16 | Discharge: 2020-12-16 | Disposition: A | Discharge status: Home / Self Care | Payer: Self-pay | Attending: Emergency Medicine | Admitting: Emergency Medicine

## 2020-12-16 DIAGNOSIS — R202 Paresthesia of skin: Secondary | ICD-10-CM

## 2020-12-16 LAB — COMPREHENSIVE METABOLIC PANEL
ALT: 14 U/L (ref 0–44)
AST: 17 U/L (ref 15–41)
Albumin: 3.9 g/dL (ref 3.5–5.0)
Alkaline Phosphatase: 62 U/L (ref 38–126)
Anion gap: 7 (ref 5–15)
BUN: 12 mg/dL (ref 6–20)
CO2: 26 mmol/L (ref 22–32)
Calcium: 9.4 mg/dL (ref 8.9–10.3)
Chloride: 105 mmol/L (ref 98–111)
Creatinine, Ser: 0.76 mg/dL (ref 0.44–1.00)
GFR, Estimated: >60 mL/min (ref >60)
Glucose, Bld: 89 mg/dL (ref 70–99)
Potassium: 4.3 mmol/L (ref 3.5–5.1)
Sodium: 138 mmol/L (ref 135–145)
Total Bilirubin: 0.9 mg/dL (ref 0.3–1.2)
Total Protein: 6.9 g/dL (ref 6.5–8.1)

## 2020-12-16 LAB — CBC WITH DIFFERENTIAL/PLATELET
Abs Immature Granulocytes: 0.01 10*3/uL (ref 0.00–0.07)
Basophils Absolute: 0 10*3/uL (ref 0.0–0.1)
Basophils Relative: 1 %
Eosinophils Absolute: 0.1 10*3/uL (ref 0.0–0.5)
Eosinophils Relative: 3 %
HCT: 38.5 % (ref 36.0–46.0)
Hemoglobin: 12.5 g/dL (ref 12.0–15.0)
Immature Granulocytes: 0 %
Lymphocytes Relative: 40 %
Lymphs Abs: 2.1 10*3/uL (ref 0.7–4.0)
MCH: 30.3 pg (ref 26.0–34.0)
MCHC: 32.5 g/dL (ref 30.0–36.0)
MCV: 93.4 fL (ref 80.0–100.0)
Monocytes Absolute: 0.4 10*3/uL (ref 0.1–1.0)
Monocytes Relative: 8 %
Neutro Abs: 2.5 10*3/uL (ref 1.7–7.7)
Neutrophils Relative %: 48 %
Platelets: 299 10*3/uL (ref 150–400)
RBC: 4.12 MIL/uL (ref 3.87–5.11)
RDW: 12.4 % (ref 11.5–15.5)
WBC: 5.2 10*3/uL (ref 4.0–10.5)
nRBC: 0 % (ref 0.0–0.2)

## 2020-12-16 LAB — I-STAT BETA HCG BLOOD, ED (MC, WL, AP ONLY): I-stat hCG, quantitative: <5 m[IU]/mL (ref <5)

## 2020-12-16 MED ORDER — METHOCARBAMOL 500 MG PO TABS: 500.0000 mg | ORAL_TABLET | Freq: Two times a day (BID) | ORAL | 0 refills | Status: DC

## 2020-12-16 NOTE — Discharge Instructions (Addendum)
As we discussed, your work-up today was reassuring.  Your CT head did show a small mucous polyp for cyst in the left sinus.  As we discussed, this is benign and just something to be aware of.  Your labs were reassuring.  As we discussed, today your symptoms could have a degree of cervical radiculopathy. You can take intermittent NSAIDs to help relieve some irritation.   You can take Tylenol or Ibuprofen as directed for pain. You can alternate Tylenol and Ibuprofen every 4 hours. If you take Tylenol at 1pm, then you can take Ibuprofen at 5pm. Then you can take Tylenol again at 9pm.   I have also prescribed a muscle relaxer. Sometimes that can help reduce irritation on the nerve. Take Robaxin as prescribed. This medication will make you drowsy so do not drive or drink alcohol when taking it.  I have given you outpatient neurology referral.  If you have not heard from them in about 2 weeks, call their office and arrange for an appointment.  Return to emergency department for any worsening symptoms, weakness of the arm, pain, discoloration of the arm, difficulty moving your legs, difficulty speaking or any other worsening concerning symptoms.

## 2020-12-16 NOTE — ED Triage Notes (Signed)
Pt reports sensation under L arm/axilla x 1 week as if she had her cell phone or an object tucked under arm.  Reports tingling to L arm x 3 days.  Denies pain.  No arm drift noted.

## 2020-12-16 NOTE — ED Provider Notes (Signed)
[facility] EMERGENCY DEPARTMENT Provider Note   CSN: [id number] Arrival date & time: 12/16/20  1321     History No chief complaint on file.   Judy Skinner is a 30 y.o. female with no significant past medical history who presents for evaluation of intermittent tingling sensation and abnormal sensation to her LUE.  She first noticed it about a week ago.  She states that on the medial aspect of her upper arm, she felt like an object was pressing on her arm.  She states that she has intermittently had that sensation since last week.  About 3 days ago, she started having intermittent tingling that starts at that medial aspect of her upper arm and radiates down her arm.  She states it is only on the medial aspect and radiates down.  She says it feels like a tingling sensation.  She still has sensation but she reports that it just will sometimes tingle.  She states that sometimes it resolves.  She initially thought she slept wrong but states that it persisted prompting ER visit.  No preceding trauma, injury, fall.  She does report that prior to onset of the tingling sensation, she was leaning on her elbow and did feel some tingling/irritation.  She has not any weakness or associated pain.  She denies any fevers, neck pain, vision changes, speech difficulty, weakness of her arms or legs, numbness/weakness of her legs, chest pain, difficulty breathing.  The history is provided by the patient.      History reviewed. No pertinent past medical history.  There are no problems to display for this patient.   History reviewed. No pertinent surgical history.   OB History   No obstetric history on file.     No family history on file.  Social History   Tobacco Use   Smoking status: Never   Smokeless tobacco: Never  Substance Use Topics   Alcohol use: Not Currently   Drug use: Not Currently    Home Medications Prior to Admission medications   Medication Sig Start Date  End Date Taking? Authorizing Provider  cholecalciferol (VITAMIN D3) 25 MCG (1000 UNIT) tablet Take 1,000 Units by mouth daily.   Yes [provider]  methocarbamol (ROBAXIN) 500 MG tablet Take 1 tablet (500 mg total) by mouth 2 (two) times daily. 12/16/20  Yes Maxwell Caul, PA-C    Allergies    Patient has no known allergies.  Review of Systems   Review of Systems  Constitutional:  Negative for fever.  Eyes:  Negative for visual disturbance.  Respiratory:  Negative for cough and shortness of breath.   Cardiovascular:  Negative for chest pain.  Gastrointestinal:  Negative for abdominal pain, nausea and vomiting.  Musculoskeletal:  Negative for neck pain.  Neurological:  Positive for numbness (tingling). Negative for speech difficulty, weakness and headaches.  All other systems reviewed and are negative.  Physical Exam Updated Vital Signs BP 108/70   Pulse 65   Temp 98.4 F (36.9 C)   Resp 16   Ht 5\' 6"  (1.676 m)   Wt 97.5 kg   LMP 12/12/2020   SpO2 100%   BMI 34.70 kg/m   Physical Exam Vitals and nursing note reviewed.  Constitutional:      Appearance: Normal appearance. She is well-developed.  HENT:     Head: Normocephalic and atraumatic.  Eyes:     General: Lids are normal.     Conjunctiva/sclera: Conjunctivae normal.     Pupils: Pupils  are equal, round, and reactive to light.  Neck:     Comments: Neck is supple and without rigidity.  No midline bony tenderness.  No warmth, erythema, edema.  Positive Spurling's maneuver. Cardiovascular:     Rate and Rhythm: Normal rate and regular rhythm.     Pulses:          Radial pulses are 2+ on the right side and 2+ on the left side.     Heart sounds: Normal heart sounds. No murmur heard.   No friction rub. No gallop.  Pulmonary:     Effort: Pulmonary effort is normal.     Breath sounds: Normal breath sounds.     Comments: Lungs clear to auscultation bilaterally.  Symmetric chest rise.  No wheezing, rales,  rhonchi. Abdominal:     Palpations: Abdomen is soft. Abdomen is not rigid.     Tenderness: There is no abdominal tenderness. There is no guarding.     Comments: Abdomen is soft, non-distended, non-tender. No rigidity, No guarding. No peritoneal signs.  Musculoskeletal:        General: Normal range of motion.     Cervical back: Full passive range of motion without pain.     Comments: LUE with no atrophy, warmth, erythema, edema. Left axilla has no wound, fluctuance, warmth, erythema or edema.   Skin:    General: Skin is warm and dry.     Capillary Refill: Capillary refill takes less than 2 seconds.     Comments: Good distal cap refill. LUE is not dusky in appearance or cool to touch.  Neurological:     Mental Status: She is alert and oriented to person, place, and time.     Comments: Cranial nerves III-XII intact Follows commands, Moves all extremities  5/5 strength to BUE and BLE  Equal grip strength bilaterally. Full strength of flexion and extension at the elbow and upper arms. She reports a tingling sensation noted to the medial aspect of the left upper extremity, particularly at the C4-C5 dermatome distribution.  She states she can feel touch but she just will occasionally feel a tingling sensation.  This sensation does not extend into the hand.  Otherwise sensation intact throughout all major nerve distributions No slurred speech. No facial droop.  Normal gait.   Psychiatric:        Speech: Speech normal.   ED Results / Procedures / Treatments   Labs (all labs ordered are listed, but only abnormal results are displayed) Labs Reviewed  COMPREHENSIVE METABOLIC PANEL  CBC WITH DIFFERENTIAL/PLATELET  I-STAT BETA HCG BLOOD, ED ([facility], [facility], [facility] ONLY)    EKG None  Radiology CT Head Wo Contrast  Result Date: 12/16/2020 CLINICAL DATA:  Left arm and axillary tingling for 4 days. Sensation of an object tucked under the left arm. EXAM: CT HEAD WITHOUT CONTRAST CT CERVICAL SPINE WITHOUT  CONTRAST TECHNIQUE: Multidetector CT imaging of the head and cervical spine was performed following the standard protocol without intravenous contrast. Multiplanar CT image reconstructions of the cervical spine were also generated. COMPARISON:  None. FINDINGS: CT HEAD FINDINGS Brain: No evidence of acute infarction, hemorrhage, hydrocephalus, extra-axial collection or mass lesion/mass effect. Vascular: No hyperdense vessel or unexpected calcification. Skull: Intact.  No focal lesion. Sinuses/Orbits: There is a mucous retention cyst or polyp in the inferior aspect of the left maxillary sinus. Otherwise negative. Other: None. CT CERVICAL SPINE FINDINGS Alignment: Normal.  Straightening of lordosis noted. Skull base and vertebrae: No acute fracture. No primary bone lesion  or focal pathologic process. Soft tissues and spinal canal: No prevertebral fluid or swelling. No visible canal hematoma. Disc levels: Intervertebral disc space height is normal. The facet joints appear normal. Upper chest: Clear. Other: None. IMPRESSION: No acute abnormality head or cervical spine. No finding to explain the patient's symptoms. Mucous retention cyst or polyp left maxillary sinus. Electronically Signed   By: Drusilla Kannerhomas  Dalessio M.D.   On: 12/16/2020 15:52   CT Cervical Spine Wo Contrast  Result Date: 12/16/2020 CLINICAL DATA:  Left arm and axillary tingling for 4 days. Sensation of an object tucked under the left arm. EXAM: CT HEAD WITHOUT CONTRAST CT CERVICAL SPINE WITHOUT CONTRAST TECHNIQUE: Multidetector CT imaging of the head and cervical spine was performed following the standard protocol without intravenous contrast. Multiplanar CT image reconstructions of the cervical spine were also generated. COMPARISON:  None. FINDINGS: CT HEAD FINDINGS Brain: No evidence of acute infarction, hemorrhage, hydrocephalus, extra-axial collection or mass lesion/mass effect. Vascular: No hyperdense vessel or unexpected calcification. Skull: Intact.   No focal lesion. Sinuses/Orbits: There is a mucous retention cyst or polyp in the inferior aspect of the left maxillary sinus. Otherwise negative. Other: None. CT CERVICAL SPINE FINDINGS Alignment: Normal.  Straightening of lordosis noted. Skull base and vertebrae: No acute fracture. No primary bone lesion or focal pathologic process. Soft tissues and spinal canal: No prevertebral fluid or swelling. No visible canal hematoma. Disc levels: Intervertebral disc space height is normal. The facet joints appear normal. Upper chest: Clear. Other: None. IMPRESSION: No acute abnormality head or cervical spine. No finding to explain the patient's symptoms. Mucous retention cyst or polyp left maxillary sinus. Electronically Signed   By: Drusilla Kannerhomas  Dalessio M.D.   On: 12/16/2020 15:52    Procedures Procedures   Medications Ordered in ED Medications - No data to display  ED Course  I have reviewed the triage vital signs and the nursing notes.  Pertinent labs & imaging results that were available during my care of the patient were reviewed by me and considered in my medical decision making (see chart for details).    MDM Rules/Calculators/A&P                          30 year old female who presents for evaluation of tingling sensation noted to her left upper extremity as well as abnormal sensation.  Initially started a week ago and the tingling started few days ago.  Has been intermittently occurring.  No trauma, injury.  No speech difficulty, vision changes, weakness.  On initial arrival, she is afebrile, nontoxic-appearing.  Vital signs are stable.  She has good pulses, cap refill.  On exam, she reports some intermittent tingling sensation noted particular to the C4-C5 distribution of the left upper arm.  This is not extended to the hand.  No weakness.  Neuro exam is reassuring.  She does have positive Spurling's maneuver.  History/physical exam not concerning for ischemic limb, septic arthritis.  Her tingling  sensation is limited to the dermatome distribution of C4-C5 it is not circumferential.  She has no weakness.  Do not suspect CVA given her presentation and reassuring neuro exam.  Her symptoms are intermittently occurring and she feels like when she presses her elbow on the table, her symptoms are worse and symptoms are intermittent in nature. Doubt that this is MS given history/physical exam. Given her positive Spurling maneuver, specific C4-C5 distribution of symptoms, favor cervical radiculopathy.  We will plan to check basic  labs to ensure there is no electrolyte abnormality contributing.  We will also obtain imaging of head and neck to ensure no intracranial mass, acute bony abnormality of the cervical spine.  I-STAT beta is negative.  CMP is unremarkable.  CBC shows no leukocytosis or anemia.  CT head negative for any acute abnormality.  She does have a mucous retention cyst in left maxillary sinus.  CT cervical spine negative for any acute bony abnormality.  I discussed results with patient.  Given patient's reassuring exam, distribution of symptoms, feel it is less likely to represent a CVA and rather to be cervical radiculopathy/irritation.  Spurling's maneuver does incite the symptoms.  At this time, history/physical exam not concerning for acute CVA.  Patient does not need emergent MRI.  We will give her outpatient neurology follow-up for further evaluation. At this time, patient exhibits no emergent life-threatening condition that require further evaluation in ED. Discussed patient with Dr. Rubin Payor who is agreeable to plan. Patient had ample opportunity for questions and discussion. All patient's questions were answered with full understanding. Strict return precautions discussed. Patient expresses understanding and agreement to plan.   Portions of this note were generated with Scientist, clinical (histocompatibility and immunogenetics). Dictation errors may occur despite best attempts at proofreading.   Final Clinical  Impression(s) / ED Diagnoses Final diagnoses:  Paresthesia    Rx / DC Orders ED Discharge Orders          Ordered    Ambulatory referral to Neurology       Comments: An appointment is requested in approximately: 2 weeks   12/16/20 1656    methocarbamol (ROBAXIN) 500 MG tablet  2 times daily        12/16/20 1657             Rosana Hoes 12/16/20 2320    Benjiman Core, MD 12/16/20 [phone]

## 2021-09-26 ENCOUNTER — Encounter (HOSPITAL_COMMUNITY): Payer: Self-pay | Admitting: Emergency Medicine

## 2021-09-26 ENCOUNTER — Other Ambulatory Visit: Payer: Self-pay

## 2021-09-26 ENCOUNTER — Emergency Department (HOSPITAL_COMMUNITY)
Admission: EM | Admit: 2021-09-26 | Discharge: 2021-09-26 | Disposition: A | Discharge status: Home / Self Care | Payer: 59 | Attending: Emergency Medicine | Admitting: Emergency Medicine

## 2021-09-26 DIAGNOSIS — R591 Generalized enlarged lymph nodes: Secondary | ICD-10-CM

## 2021-09-26 DIAGNOSIS — R590 Localized enlarged lymph nodes: Secondary | ICD-10-CM | POA: Diagnosis present

## 2021-09-26 NOTE — ED Provider Notes (Signed)
?[facility] EMERGENCY DEPARTMENT ?Provider Note ? ? ?CSN: [id number] ?Arrival date & time: 09/26/21  1943 ? ?  ? ?History ? ?Chief Complaint  ?Patient presents with  ? Mass  ? ? ?Judy Skinner is a 31 y.o. female presenting with a "bump to the back of her neck."  Says she does not have a primary care provider because she just got health insurance.  Denies any pain, fever or chills just noticed it when she was rubbing the back of her neck.  She called an urgent care however they told her that if any imaging was needed she would need to go to the emergency department so she came to the emergency department to begin with.  No trouble breathing, swallowing or limitations in her range of motion. ? ? ? ?  ? ?Home Medications ?Prior to Admission medications   ?Medication Sig Start Date End Date Taking? Authorizing Provider  ?cholecalciferol (VITAMIN D3) 25 MCG (1000 UNIT) tablet Take 1,000 Units by mouth daily.    [provider]  ?methocarbamol (ROBAXIN) 500 MG tablet Take 1 tablet (500 mg total) by mouth 2 (two) times daily. 12/16/20   Maxwell Caul, PA-C  ?   ? ?Allergies    ?Patient has no known allergies.   ? ?Review of Systems   ?Review of Systems ? ?Physical Exam ?Updated Vital Signs ?BP 129/72   Pulse 85   Temp 98.9 ?F (37.2 ?C) (Oral)   Resp 15   LMP 09/19/2021   SpO2 100%  ?Physical Exam ?Vitals and nursing note reviewed.  ?Constitutional:   ?   Appearance: Normal appearance. She is not ill-appearing.  ?HENT:  ?   Head: Normocephalic and atraumatic.  ?Eyes:  ?   General: No scleral icterus. ?   Conjunctiva/sclera: Conjunctivae normal.  ?Neck:  ?   Comments: Lymphadenopathy in the left occipital node ?Pulmonary:  ?   Effort: Pulmonary effort is normal. No respiratory distress.  ?Musculoskeletal:  ?   Cervical back: Normal range of motion.  ?Skin: ?   Findings: No rash.  ?Neurological:  ?   Mental Status: She is alert.  ?Psychiatric:     ?   Mood and Affect: Mood normal.   ? ? ?ED Results / Procedures / Treatments   ?Labs ?(all labs ordered are listed, but only abnormal results are displayed) ?Labs Reviewed - No data to display ? ?EKG ?None ? ?Radiology ?No results found. ? ?Procedures ?Procedures  ? ?Medications Ordered in ED ?Medications - No data to display ? ?ED Course/ Medical Decision Making/ A&P ?  ?                        ?Medical Decision Making ? ?31 year old female presenting with a lump in the back of her neck on the left side.  Consistent with occipital lymphadenopathy.  Patient has not seen a primary care provider and does not have 1.  She will be safely discharged to follow-up with the PCP.  She was offered to wait in the waiting room for full evaluation however I told her that I am happy to discharge her from triage as no imaging is indicated.  She is agreeable to this.  Ambulated out of the department ? ?Final Clinical Impression(s) / ED Diagnoses ?Final diagnoses:  ?Lymphadenopathy  ? ? ?Rx / DC Orders ?Results and diagnoses were explained to the patient. Return precautions discussed in full. Patient had no additional questions and expressed complete  understanding. ? ? ?This chart was dictated using voice recognition software.  Despite best efforts to proofread,  errors can occur which can change the documentation meaning.  ? ? ?  ?Danaria Larsen A, PA-C ?09/26/21 2007 ? ?  ?Rolan Bucco, MD ?09/27/21 0008 ? ?

## 2021-09-26 NOTE — ED Triage Notes (Signed)
Pt c/o "lump" to posterior neck. Denies pain or drainage from area. ?

## 2021-09-26 NOTE — Discharge Instructions (Addendum)
Read the information about inflamed lymph nodes attached to these papers.  Follow-up with a primary care provider, you should get established with 1 for your general health as well. ?

## 2021-12-04 LAB — HM PAP SMEAR

## 2022-03-13 ENCOUNTER — Ambulatory Visit: Payer: Self-pay | Admitting: Nurse Practitioner

## 2022-03-26 ENCOUNTER — Ambulatory Visit: Payer: Self-pay | Admitting: Nurse Practitioner

## 2022-04-08 ENCOUNTER — Ambulatory Visit (INDEPENDENT_AMBULATORY_CARE_PROVIDER_SITE_OTHER): Payer: Commercial Managed Care - HMO | Admitting: Nurse Practitioner

## 2022-04-08 ENCOUNTER — Encounter: Payer: Self-pay | Admitting: Nurse Practitioner

## 2022-04-08 VITALS — BP 115/79 | HR 75 | Temp 98.6°F | Ht 66.0 in | Wt 207.0 lb

## 2022-04-08 DIAGNOSIS — Z23 Encounter for immunization: Secondary | ICD-10-CM | POA: Diagnosis not present

## 2022-04-08 DIAGNOSIS — R5383 Other fatigue: Secondary | ICD-10-CM | POA: Diagnosis not present

## 2022-04-08 DIAGNOSIS — K649 Unspecified hemorrhoids: Secondary | ICD-10-CM | POA: Diagnosis not present

## 2022-04-08 DIAGNOSIS — Z111 Encounter for screening for respiratory tuberculosis: Secondary | ICD-10-CM

## 2022-04-08 DIAGNOSIS — E669 Obesity, unspecified: Secondary | ICD-10-CM

## 2022-04-08 NOTE — Assessment & Plan Note (Signed)
Wt Readings from Last 3 Encounters:  04/08/22 207 lb (93.9 kg)  12/16/20 215 lb (97.5 kg)  States that she has been paying attention to her diet but has not been exercising regular regularly Patient counseled on low-carb modified diet She was encouraged to engage in regular moderate to vigorous exercise and at least 150 minutes weekly.

## 2022-04-08 NOTE — Assessment & Plan Note (Signed)
Patient educated on CDC recommendation for the influenza vaccine. Verbal consent was obtained from the patient, vaccine administered by nurse, no sign of adverse reactions noted at this time. Patient education on arm soreness and use of tylenol for this patient  was discussed. Patient educated on the signs and symptoms of adverse effect and advise to contact the office if they occur.  

## 2022-04-08 NOTE — Patient Instructions (Signed)
Flu vaccine today , please  a release  of information form to get your records from your previous PCP.    It is important that you exercise regularly at least 30 minutes 5 times a week as tolerated  Think about what you will eat, plan ahead. Choose " clean, green, fresh or frozen" over canned, processed or packaged foods which are more sugary, salty and fatty. 70 to 75% of food eaten should be vegetables and fruit. Three meals at set times with snacks allowed between meals, but they must be fruit or vegetables. Aim to eat over a 12 hour period , example 7 am to 7 pm, and STOP after  your last meal of the day. Drink water,generally about 64 ounces per day, no other drink is as healthy. Fruit juice is best enjoyed in a healthy way, by EATING the fruit.  Thanks for choosing [facility] we consider it a privelige to serve you.

## 2022-04-08 NOTE — Assessment & Plan Note (Signed)
Check CBC to rule out anemia She is taking vitamin D supplements for vitamin D deficiency Patient encouraged to engage in regular moderate to vigorous exercises at least 150 minutes weekly

## 2022-04-08 NOTE — Progress Notes (Signed)
Hemorrhoids Has medical question about her mother medical history.

## 2022-04-08 NOTE — Progress Notes (Signed)
New Patient Office Visit  Subjective:  Patient ID: Judy Skinner, female    DOB: 01/15/1991  Age: 31 y.o. MRN: [id number]  CC:  Chief Complaint  Patient presents with   New Patient (Initial Visit)    HPI Judy Skinner is a 32 y.o. female with past medical history of vitamin D deficiency, hemorrhoids presents for establishing care.  Previous PCP at [facility] , last visit was a month ago, last CPE was July 2023.   Has ongoing issues with hemorroids, sometimes hemorrhoids is under control sometimes its not,  she was seen by a specialist in 2019 , she had banding done multiple times but not successful. She sees  bright red blood in her BM, saw a clot only once. Uses suppository and prep H solution  when it gets irritated.  Patient denies anal pain, nausea, vomiting, abdominal pain, constipation, diarrhea.  Fatigue. States that her last blood work was normal except for vitamin D deficiency, she is taking vitamin D supplement , does not know how much . She  does not engage in regular exercise, she drinks water most of the day .    Needs TB skin test for a new job  Last PAP was in 2020 , not sure of her last Tdap vaccine.    Past Medical History:  Diagnosis Date   Hemorrhoids    No pertinent past medical history     Past Surgical History:  Procedure Laterality Date   pluredisis Right 2021    Family History  Problem Relation Age of Onset   Multiple sclerosis Mother    Lupus Mother    Fibromyalgia Mother    Breast cancer Maternal Grandmother    Breast cancer Paternal Grandmother    Colon cancer Neg Hx    Cervical cancer Neg Hx     Social History   Socioeconomic History   Marital status: Single    Spouse name: Not on file   Number of children: 1   Years of education: Not on file   Highest education level: Not on file  Occupational History   Not on file  Tobacco Use   Smoking status: Never   Smokeless tobacco: Never  Substance and Sexual Activity    Alcohol use: Not Currently   Drug use: Not Currently   Sexual activity: Not Currently  Other Topics Concern   Not on file  Social History Narrative   Lives by herself    Social Determinants of Health   Financial Resource Strain: Not on file  Food Insecurity: Not on file  Transportation Needs: Not on file  Physical Activity: Not on file  Stress: Not on file  Social Connections: Not on file  Intimate Partner Violence: Not on file    ROS Review of Systems  Constitutional:  Positive for fatigue. Negative for activity change, appetite change, chills, diaphoresis, fever and unexpected weight change.  HENT: Negative.  Negative for congestion, dental problem and drooling.   Respiratory: Negative.  Negative for apnea, cough, shortness of breath and wheezing.   Cardiovascular: Negative.  Negative for chest pain, palpitations and leg swelling.  Gastrointestinal:  Positive for blood in stool. Negative for abdominal distention, abdominal pain, anal bleeding, constipation, diarrhea, nausea, rectal pain and vomiting.  Endocrine: Negative.   Genitourinary: Negative.  Negative for difficulty urinating, dyspareunia and enuresis.  Musculoskeletal: Negative.  Negative for arthralgias, back pain and gait problem.  Skin: Negative.  Negative for color change, pallor and rash.  Allergic/Immunologic: Negative.   Neurological:  Negative.   Hematological: Negative.   Psychiatric/Behavioral: Negative.  Negative for agitation, behavioral problems and confusion.     Objective:   Today's Vitals: BP 115/79   Pulse 75   Temp 98.6 F (37 C) (Oral)   Ht 5\' 6"  (1.676 m)   Wt 207 lb (93.9 kg)   LMP 03/25/2022 (Approximate)   SpO2 100%   BMI 33.41 kg/m   Physical Exam Constitutional:      General: She is not in acute distress.    Appearance: Normal appearance. She is obese. She is not ill-appearing, toxic-appearing or diaphoretic.  HENT:     Head: Normocephalic and atraumatic.     Right Ear: Tympanic  membrane, ear canal and external ear normal. There is no impacted cerumen.     Left Ear: Tympanic membrane, ear canal and external ear normal. There is no impacted cerumen.     Nose: Nose normal. No congestion or rhinorrhea.     Mouth/Throat:     Mouth: Mucous membranes are moist.     Pharynx: Oropharynx is clear. No oropharyngeal exudate or posterior oropharyngeal erythema.  Eyes:     General: No scleral icterus.       Right eye: No discharge.        Left eye: No discharge.     Extraocular Movements: Extraocular movements intact.     Conjunctiva/sclera: Conjunctivae normal.     Pupils: Pupils are equal, round, and reactive to light.  Neck:     Vascular: No carotid bruit.  Cardiovascular:     Rate and Rhythm: Normal rate and regular rhythm.     Pulses: Normal pulses.     Heart sounds: Normal heart sounds. No murmur heard.    No friction rub. No gallop.  Pulmonary:     Effort: No respiratory distress.     Breath sounds: Normal breath sounds. No stridor. No wheezing, rhonchi or rales.  Chest:     Chest wall: No tenderness.  Abdominal:     General: There is no distension.     Palpations: Abdomen is soft.     Tenderness: There is no abdominal tenderness. There is no guarding.  Genitourinary:    Comments: Patient declined rectal exam today  Musculoskeletal:        General: No swelling, tenderness, deformity or signs of injury.     Cervical back: Normal range of motion and neck supple. No rigidity or tenderness.     Right lower leg: No edema.     Left lower leg: No edema.  Lymphadenopathy:     Cervical: No cervical adenopathy.  Skin:    General: Skin is warm and dry.     Capillary Refill: Capillary refill takes less than 2 seconds.     Coloration: Skin is not jaundiced or pale.     Findings: No bruising, erythema, lesion or rash.  Neurological:     Mental Status: She is alert and oriented to person, place, and time.     Cranial Nerves: No cranial nerve deficit.     Sensory: No  sensory deficit.     Motor: No weakness.     Coordination: Coordination normal.     Gait: Gait normal.     Deep Tendon Reflexes: Reflexes normal.  Psychiatric:        Mood and Affect: Mood normal.        Behavior: Behavior normal.        Thought Content: Thought content normal.  Judgment: Judgment normal.     Assessment & Plan:   Problem List Items Addressed This Visit       Cardiovascular and Mediastinum   Hemorrhoids - Primary    Chronic uncontrolled condition Has had multiple bindings done without success Currently sees blood in her bowel movement she denies anal pain, nausea, vomiting, diarrhea, constipation. Patient referred to GI, she may eventually need surgery.  Continue preparation H as needed, use of sitz bath encouraged to eat plenty of fiber to prevent constipation, She declined a rectal exam today       Relevant Orders   Ambulatory referral to Gastroenterology   CBC     Other   Need for immunization against influenza    Patient educated on CDC recommendation for the influenza  vaccine. Verbal consent was obtained from the patient, vaccine administered by nurse, no sign of adverse reactions noted at this time. Patient education on arm soreness and use of tylenol  for this patient  was discussed. Patient educated on the signs and symptoms of adverse effect and advise to contact the office if they occur.       Relevant Orders   Flu Vaccine QUAD 77mo+IM (Fluarix, Fluzone & Alfiuria Quad PF) (Completed)   Screening for tuberculosis    QuantiFERON-TB Gold Plus Needed for her new job Patient denies cough, chest pain, wheezing, shortness of breath      Relevant Orders   QuantiFERON-TB Gold Plus   Fatigue    Check CBC to rule out anemia She is taking vitamin D supplements for vitamin D deficiency Patient encouraged to engage in regular moderate to vigorous exercises at least 150 minutes weekly      Relevant Orders   CBC   Obesity (BMI 30-39.9)    Wt  Readings from Last 3 Encounters:  04/08/22 207 lb (93.9 kg)  12/16/20 215 lb (97.5 kg)  States that she has been paying attention to her diet but has not been exercising regular regularly Patient counseled on low-carb modified diet She was encouraged to engage in regular moderate to vigorous exercise and at least 150 minutes weekly.       Outpatient Encounter Medications as of 04/08/2022  Medication Sig   cholecalciferol (VITAMIN D3) 25 MCG (1000 UNIT) tablet Take 1,000 Units by mouth daily.   methocarbamol (ROBAXIN) 500 MG tablet Take 1 tablet (500 mg total) by mouth 2 (two) times daily. (Patient not taking: Reported on 04/08/2022)   No facility-administered encounter medications on file as of 04/08/2022.    Follow-up: Return in about 3 months (around 07/09/2022) for PAP exam fu for hemorroidsi .   Renee Rival, FNP

## 2022-04-08 NOTE — Assessment & Plan Note (Addendum)
Chronic uncontrolled condition Has had multiple bindings done without success Currently sees blood in her bowel movement she denies anal pain, nausea, vomiting, diarrhea, constipation. Patient referred to GI, she may eventually need surgery.  Continue preparation H as needed, use of sitz bath encouraged to eat plenty of fiber to prevent constipation, She declined a rectal exam today

## 2022-04-08 NOTE — Assessment & Plan Note (Signed)
QuantiFERON-TB Gold Plus Needed for her new job Patient denies cough, chest pain, wheezing, shortness of breath

## 2022-04-09 LAB — CBC
Hematocrit: 36.1 % (ref 34.0–46.6)
Hemoglobin: 11.5 g/dL (ref 11.1–15.9)
MCH: 27.6 pg (ref 26.6–33.0)
MCHC: 31.9 g/dL (ref 31.5–35.7)
MCV: 87 fL (ref 79–97)
Platelets: 273 10*3/uL (ref 150–450)
RBC: 4.17 x10E6/uL (ref 3.77–5.28)
RDW: 15.7 % — ABNORMAL HIGH (ref 11.7–15.4)
WBC: 4.8 10*3/uL (ref 3.4–10.8)

## 2022-04-09 NOTE — Progress Notes (Signed)
Quantiferon  TB gold pending

## 2022-04-09 NOTE — Progress Notes (Signed)
Normal lab, no anemia

## 2022-04-10 ENCOUNTER — Other Ambulatory Visit: Payer: Self-pay | Admitting: Nurse Practitioner

## 2022-04-10 LAB — QUANTIFERON-TB GOLD PLUS
QuantiFERON Mitogen Value: >10 IU/mL
QuantiFERON Nil Value: 0.35 IU/mL
QuantiFERON TB1 Ag Value: 0.19 IU/mL
QuantiFERON TB2 Ag Value: 0.25 IU/mL
QuantiFERON-TB Gold Plus: NEGATIVE

## 2022-04-10 NOTE — Progress Notes (Signed)
Test Negative for TB

## 2022-04-12 ENCOUNTER — Telehealth: Payer: Self-pay

## 2022-04-12 NOTE — Telephone Encounter (Signed)
Called pt to come by and pick up forms that are completed. No answer and lvm .   Elyse Jarvis RMA

## 2022-05-03 IMAGING — CT CT HEAD W/O CM
4 series · 15 of 47 positions shown, 17 images · non-contrast
Comparison: None.

CLINICAL DATA: Left arm and axillary tingling for 4 days. Sensation
of an object tucked under the left arm.

EXAM:
CT HEAD WITHOUT CONTRAST
CT CERVICAL SPINE WITHOUT CONTRAST
TECHNIQUE: Multidetector CT imaging of the head and cervical spine was
performed following the standard protocol without intravenous
contrast. Multiplanar CT image reconstructions of the cervical spine
were also generated.

[Series 3: head without · axial · non-contrast · 0.42mm/px · z∈[-78,+42]mm · 7 of 34 slices shown, 9 images]
[im 5/34  brain]
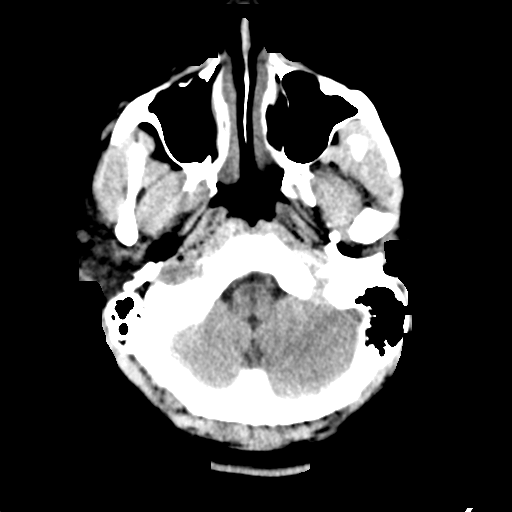
[im 5/34  bone]
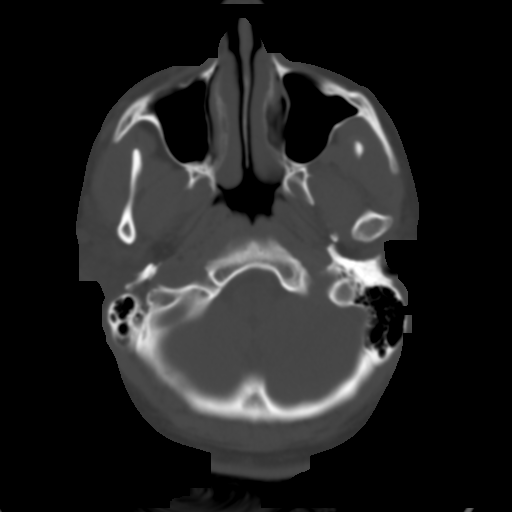
[im 9/34  brain]
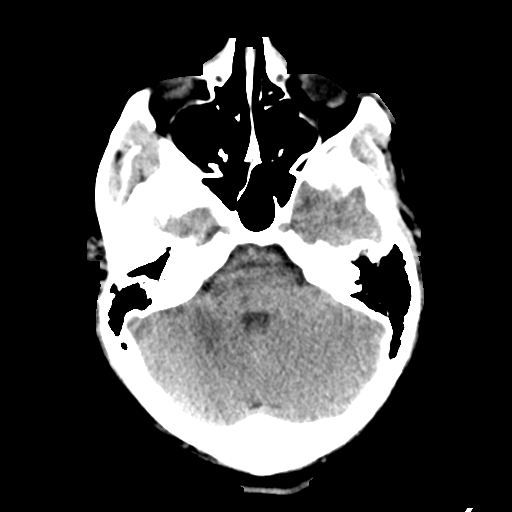
[im 13/34  brain]
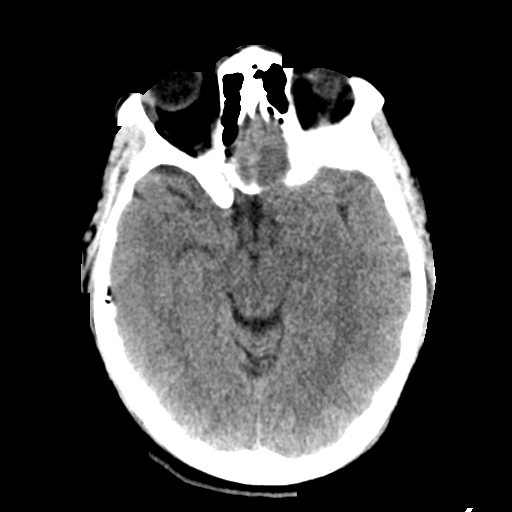
[im 17/34  brain]
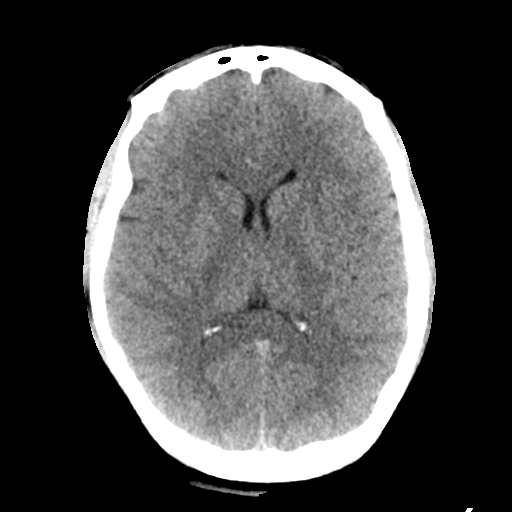
[im 21/34  brain]
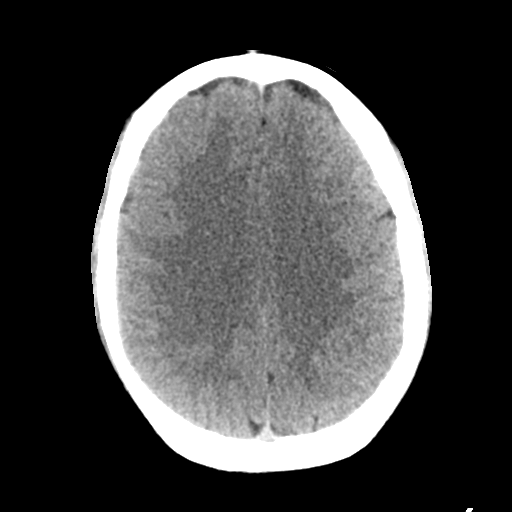
[im 21/34  bone]
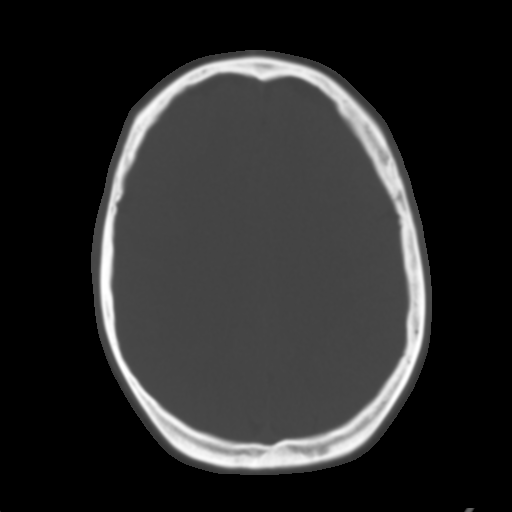
[im 25/34  brain]
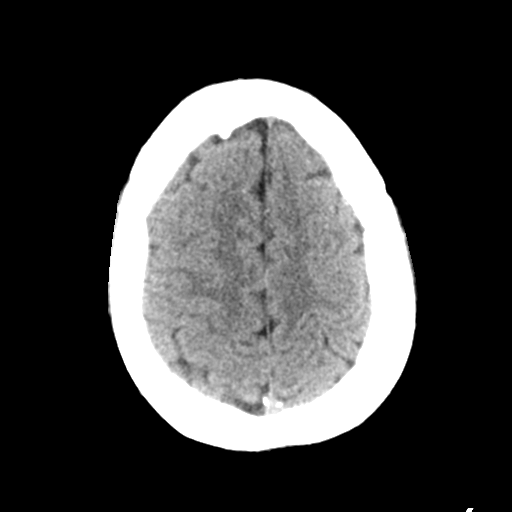
[im 29/34  brain]
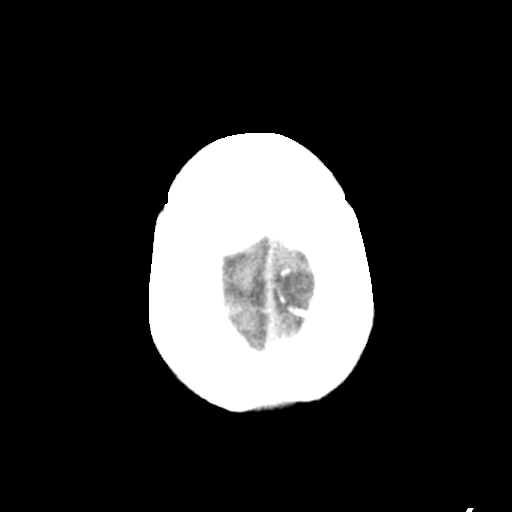

[Series 4: head bone · axial · 0.42mm/px · z∈[-82,-66]mm · 2 of 84 slices shown]
[im 9/84  bone]
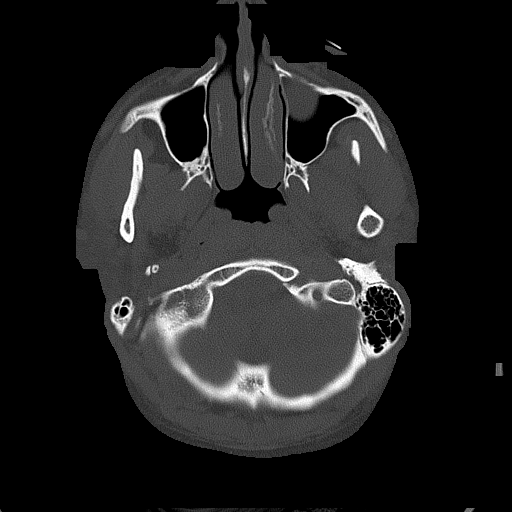
[im 17/84  bone]
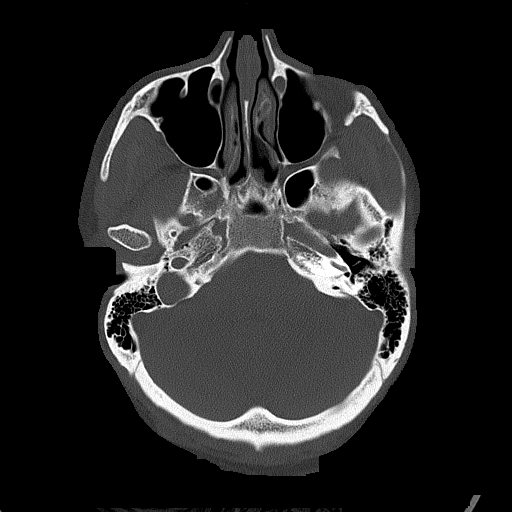

[Series 5: head without cor · coronal · non-contrast · 0.29mm/px · 3 of 72 slices shown]
[im 25/72  brain]
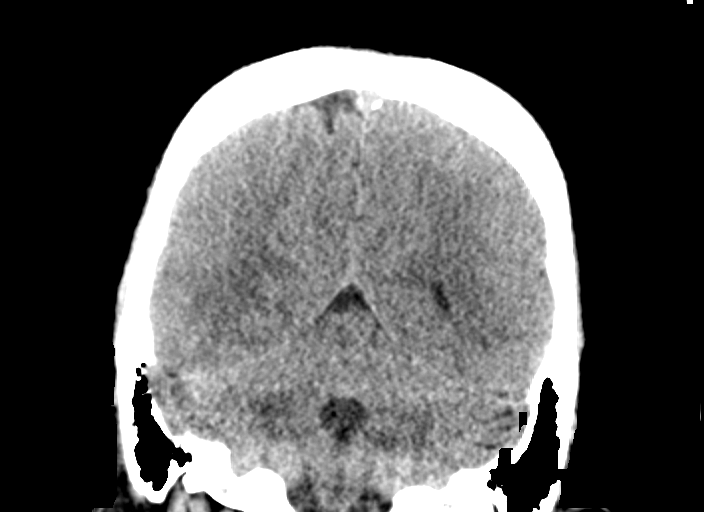
[im 32/72  brain]
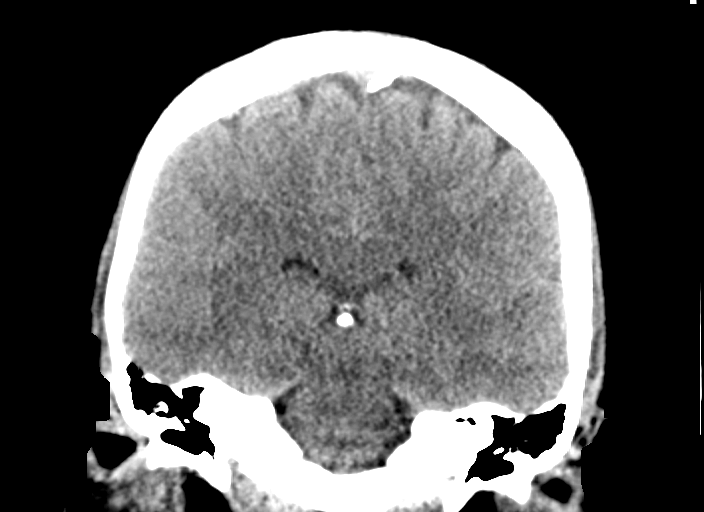
[im 40/72  brain]
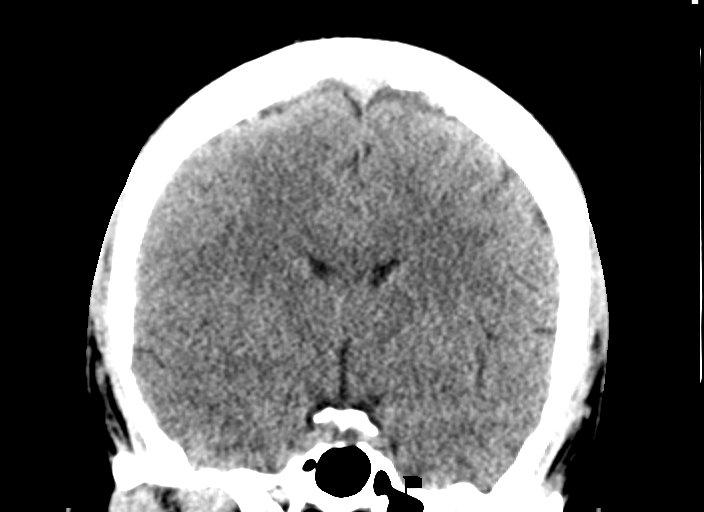

[Series 6: head without sag · sagittal · non-contrast · 0.29mm/px · 3 of 67 slices shown]
[im 23/67  brain]
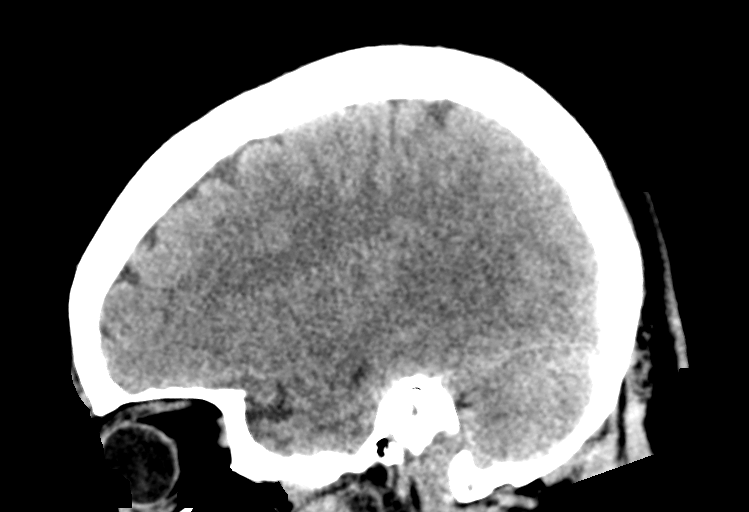
[im 34/67  brain]
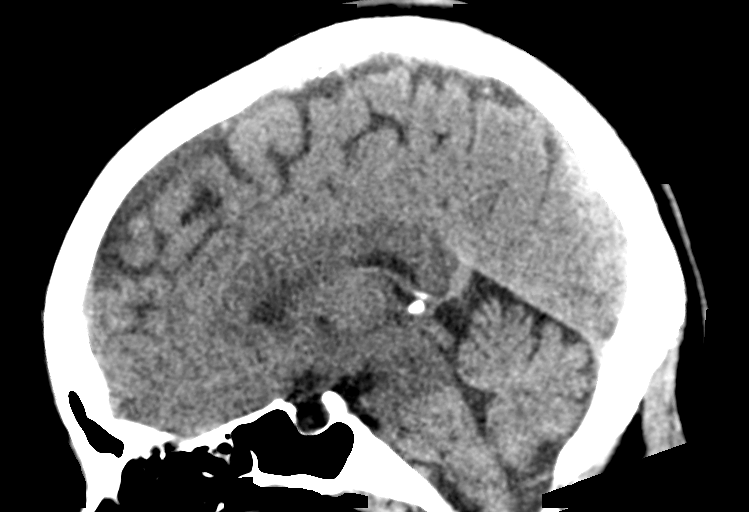
[im 45/67  brain]
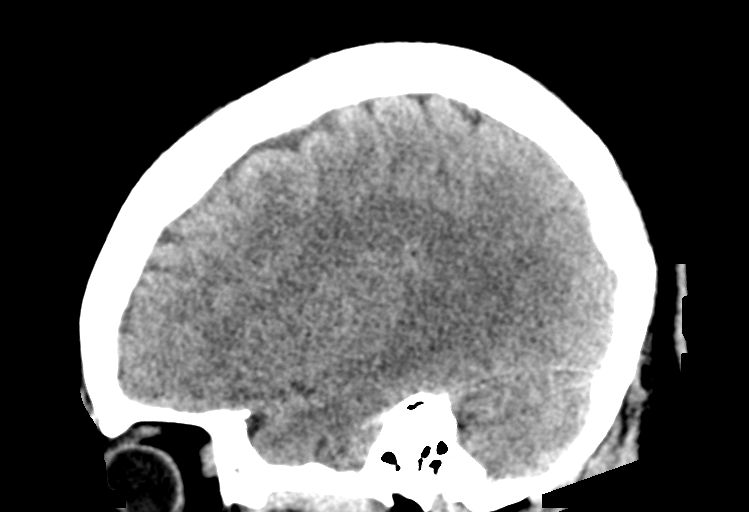

[15 of 47 positions shown; findings below may reference images not displayed]

FINDINGS: CT HEAD FINDINGS

Brain: No evidence of acute infarction, hemorrhage, hydrocephalus,
extra-axial collection or mass lesion/mass effect.

Vascular: No hyperdense vessel or unexpected calcification.

Skull: Intact.  No focal lesion.

Sinuses/Orbits: There is a mucous retention cyst or polyp in the
inferior aspect of the left maxillary sinus. Otherwise negative.

Other: None.

CT CERVICAL SPINE FINDINGS

Alignment: Normal.  Straightening of lordosis noted.

Skull base and vertebrae: No acute fracture. No primary bone lesion
or focal pathologic process.

Soft tissues and spinal canal: No prevertebral fluid or swelling. No
visible canal hematoma.

Disc levels: Intervertebral disc space height is normal. The facet
joints appear normal.

Upper chest: Clear.

Other: None.
IMPRESSION: No acute abnormality head or cervical spine. No finding to explain
the patient's symptoms.

Mucous retention cyst or polyp left maxillary sinus.

## 2022-05-16 ENCOUNTER — Encounter: Payer: Self-pay | Admitting: Nurse Practitioner

## 2022-05-21 ENCOUNTER — Encounter: Payer: Self-pay | Admitting: Gastroenterology

## 2022-06-24 ENCOUNTER — Encounter: Payer: Self-pay | Admitting: Gastroenterology

## 2022-06-27 ENCOUNTER — Ambulatory Visit: Payer: Commercial Managed Care - HMO | Admitting: Gastroenterology

## 2022-07-08 ENCOUNTER — Other Ambulatory Visit (HOSPITAL_COMMUNITY)
Admission: RE | Admit: 2022-07-08 | Discharge: 2022-07-08 | Disposition: A | Discharge status: Home / Self Care | Payer: Commercial Managed Care - HMO | Source: Ambulatory Visit | Attending: Nurse Practitioner | Admitting: Nurse Practitioner

## 2022-07-08 ENCOUNTER — Ambulatory Visit (INDEPENDENT_AMBULATORY_CARE_PROVIDER_SITE_OTHER): Payer: Commercial Managed Care - HMO | Admitting: Nurse Practitioner

## 2022-07-08 ENCOUNTER — Encounter: Payer: Self-pay | Admitting: Nurse Practitioner

## 2022-07-08 VITALS — BP 108/70 | HR 85 | Temp 98.2°F | Wt 210.8 lb

## 2022-07-08 DIAGNOSIS — K649 Unspecified hemorrhoids: Secondary | ICD-10-CM | POA: Diagnosis not present

## 2022-07-08 DIAGNOSIS — Z124 Encounter for screening for malignant neoplasm of cervix: Secondary | ICD-10-CM | POA: Insufficient documentation

## 2022-07-08 NOTE — Assessment & Plan Note (Signed)
-   Cytology - PAP([place])   

## 2022-07-08 NOTE — Patient Instructions (Signed)
It is important that you exercise regularly at least 30 minutes 5 times a week as tolerated  Think about what you will eat, plan ahead. Choose " clean, green, fresh or frozen" over canned, processed or packaged foods which are more sugary, salty and fatty. 70 to 75% of food eaten should be vegetables and fruit. Three meals at set times with snacks allowed between meals, but they must be fruit or vegetables. Aim to eat over a 12 hour period , example 7 am to 7 pm, and STOP after  your last meal of the day. Drink water,generally about 64 ounces per day, no other drink is as healthy. Fruit juice is best enjoyed in a healthy way, by EATING the fruit.  Thanks for choosing [facility] we consider it a privelige to serve you.

## 2022-07-08 NOTE — Assessment & Plan Note (Signed)
She has upcoming appointment with GI, feels like she is not ready for surgery at this time.  Continue sitz bath and preparation H as needed. No external hemorrhoids noted on examination today

## 2022-07-08 NOTE — Progress Notes (Addendum)
Established Patient Office Visit  Subjective:  Patient ID: Judy Skinner, female    DOB: 11-25-1990  Age: 32 y.o. MRN: [id number]  CC:  Chief Complaint  Patient presents with   Follow-up    With a pap    HPI Judy Skinner is a 32 y.o. female with past medical history of hemorrhoids, vitamin D deficiency presents for cervical Pap smear.  She has upcoming appointment with GI for her hemorrhoids.  She has been using Preparation H as needed.  Her hemorrhoids bleeds, does prolapse and gets irritated sometimes. She presents with er medical records from her previous PCP.   She denies chest pain, wheezing, shortness of breath, fever, chills, fatigue,  malaise, abdominal pain, nausea, vomiting, diarrhea, constipation Past Medical History:  Diagnosis Date   Hemorrhoids    No pertinent past medical history     Past Surgical History:  Procedure Laterality Date   pluredisis Right 2021    Family History  Problem Relation Age of Onset   Multiple sclerosis Mother    Lupus Mother    Fibromyalgia Mother    Breast cancer Maternal Grandmother    Breast cancer Paternal Grandmother    Colon cancer Neg Hx    Cervical cancer Neg Hx     Social History   Socioeconomic History   Marital status: Single    Spouse name: Not on file   Number of children: 1   Years of education: Not on file   Highest education level: Not on file  Occupational History   Not on file  Tobacco Use   Smoking status: Never   Smokeless tobacco: Never  Substance and Sexual Activity   Alcohol use: Not Currently   Drug use: Not Currently   Sexual activity: Not Currently  Other Topics Concern   Not on file  Social History Narrative   Lives by herself    Social Determinants of Health   Financial Resource Strain: Not on file  Food Insecurity: Not on file  Transportation Needs: Not on file  Physical Activity: Not on file  Stress: Not on file  Social Connections: Not on file  Intimate Partner Violence:  Not on file    Outpatient Medications Prior to Visit  Medication Sig Dispense Refill   ASHWAGANDHA PO Take by mouth.     B Complex Vitamins (VITAMIN B-COMPLEX PO) Take by mouth.     cholecalciferol (VITAMIN D3) 25 MCG (1000 UNIT) tablet Take 1,000 Units by mouth daily.     Omega-3 Fatty Acids (FISH OIL CONCENTRATE PO) Take by mouth.     methocarbamol (ROBAXIN) 500 MG tablet Take 1 tablet (500 mg total) by mouth 2 (two) times daily. (Patient not taking: Reported on 04/08/2022) 20 tablet 0   No facility-administered medications prior to visit.    No Known Allergies  ROS Review of Systems  Constitutional:  Negative for activity change, appetite change, chills, diaphoresis, fatigue, fever and unexpected weight change.  Respiratory:  Negative for apnea, cough, choking, chest tightness, shortness of breath, wheezing and stridor.   Cardiovascular:  Negative for chest pain, palpitations and leg swelling.  Gastrointestinal:  Negative for abdominal distention, abdominal pain, anal bleeding and blood in stool.  Endocrine: Negative.   Genitourinary:  Negative for difficulty urinating, dysuria, enuresis, flank pain, frequency and genital sores.  Skin: Negative.   Neurological: Negative.  Negative for dizziness, seizures, facial asymmetry, light-headedness, numbness and headaches.  Psychiatric/Behavioral:  Negative for agitation, behavioral problems, confusion, decreased concentration, dysphoric mood and hallucinations.  Objective:    Physical Exam Exam conducted with a chaperone present.  Constitutional:      General: She is not in acute distress.    Appearance: She is obese. She is not ill-appearing, toxic-appearing or diaphoretic.  Eyes:     General: No scleral icterus.       Right eye: No discharge.        Left eye: No discharge.     Extraocular Movements: Extraocular movements intact.     Conjunctiva/sclera: Conjunctivae normal.  Neck:     Vascular: No carotid bruit.   Cardiovascular:     Rate and Rhythm: Normal rate and regular rhythm.     Pulses: Normal pulses.     Heart sounds: Normal heart sounds. No murmur heard.    No friction rub. No gallop.  Pulmonary:     Effort: Pulmonary effort is normal. No respiratory distress.     Breath sounds: Normal breath sounds. No stridor. No wheezing, rhonchi or rales.  Chest:     Chest wall: No mass, lacerations, deformity, swelling, tenderness, crepitus or edema.  Breasts:    Breasts are symmetrical.     Right: Normal. No swelling, bleeding, inverted nipple, mass, nipple discharge, skin change or tenderness.     Left: Normal. No swelling, bleeding, inverted nipple, mass, nipple discharge, skin change or tenderness.  Abdominal:     General: There is no distension.     Palpations: There is no mass.     Tenderness: There is no abdominal tenderness. There is no right CVA tenderness, left CVA tenderness, guarding or rebound.     Hernia: No hernia is present. There is no hernia in the left inguinal area or right inguinal area.  Genitourinary:    Exam position: Lithotomy position.     Pubic Area: No rash or pubic lice.      Tanner stage (genital): 5.     Labia:        Right: No rash, tenderness, lesion or injury.        Left: No rash, tenderness, lesion or injury.      Urethra: No prolapse, urethral pain, urethral swelling or urethral lesion.     Vagina: No signs of injury and foreign body. No vaginal discharge, erythema, tenderness, bleeding, lesions or prolapsed vaginal walls.     Cervix: No cervical motion tenderness, discharge, friability, lesion, erythema, cervical bleeding or eversion.     Uterus: Not deviated, not enlarged, not fixed, not tender and no uterine prolapse.      Adnexa:        Right: No mass, tenderness or fullness.         Left: No mass, tenderness or fullness.       Rectum: No external hemorrhoid.  Musculoskeletal:        General: No swelling, tenderness, deformity or signs of injury.      Cervical back: No rigidity or tenderness.     Right lower leg: No edema.     Left lower leg: No edema.  Lymphadenopathy:     Cervical: No cervical adenopathy.     Upper Body:     Right upper body: No supraclavicular, axillary or pectoral adenopathy.     Left upper body: No supraclavicular, axillary or pectoral adenopathy.     Lower Body: No right inguinal adenopathy. No left inguinal adenopathy.  Skin:    General: Skin is warm and dry.     Capillary Refill: Capillary refill takes less than 2 seconds.  Coloration: Skin is not jaundiced or pale.     Findings: No bruising, erythema, lesion or rash.  Neurological:     Mental Status: She is alert and oriented to person, place, and time.     Cranial Nerves: No cranial nerve deficit.     Sensory: No sensory deficit.     Motor: No weakness.     Coordination: Coordination normal.     Gait: Gait normal.     Deep Tendon Reflexes: Reflexes normal.  Psychiatric:        Mood and Affect: Mood normal.        Behavior: Behavior normal.        Thought Content: Thought content normal.        Judgment: Judgment normal.     BP 108/70   Pulse 85   Temp 98.2 F (36.8 C)   Wt 210 lb 12.8 oz (95.6 kg)   SpO2 99%   BMI 34.02 kg/m  Wt Readings from Last 3 Encounters:  07/08/22 210 lb 12.8 oz (95.6 kg)  04/08/22 207 lb (93.9 kg)  12/16/20 215 lb (97.5 kg)    No results found for: "TSH" Lab Results  Component Value Date   WBC 4.8 04/08/2022   HGB 11.5 04/08/2022   HCT 36.1 04/08/2022   MCV 87 04/08/2022   PLT 273 04/08/2022   Lab Results  Component Value Date   NA 138 12/16/2020   K 4.3 12/16/2020   CO2 26 12/16/2020   GLUCOSE 89 12/16/2020   BUN 12 12/16/2020   CREATININE 0.76 12/16/2020   BILITOT 0.9 12/16/2020   ALKPHOS 62 12/16/2020   AST 17 12/16/2020   ALT 14 12/16/2020   PROT 6.9 12/16/2020   ALBUMIN 3.9 12/16/2020   CALCIUM 9.4 12/16/2020   ANIONGAP 7 12/16/2020   No results found for: "CHOL" No results found for:  "HDL" No results found for: "LDLCALC" No results found for: "TRIG" No results found for: "CHOLHDL" No results found for: "HGBA1C"    Assessment & Plan:   Problem List Items Addressed This Visit       Cardiovascular and Mediastinum   Hemorrhoids    She has upcoming appointment with GI, feels like she is not ready for surgery at this time.  Continue sitz bath and preparation H as needed. No external hemorrhoids noted on examination today        Other   Screening for cervical cancer - Primary     - Cytology - PAP([place])       Relevant Orders   Cytology - PAP([place])    No orders of the defined types were placed in this encounter.   Follow-up: Return in about 8 months (around 03/09/2023) for CPE.    Renee Rival, FNP

## 2022-07-11 LAB — CYTOLOGY - PAP: Diagnosis: NEGATIVE

## 2022-07-12 NOTE — Progress Notes (Signed)
NORMAL pap cytology

## 2022-07-16 ENCOUNTER — Encounter: Payer: Self-pay | Admitting: Gastroenterology

## 2022-07-16 ENCOUNTER — Ambulatory Visit (INDEPENDENT_AMBULATORY_CARE_PROVIDER_SITE_OTHER): Payer: Commercial Managed Care - HMO | Admitting: Gastroenterology

## 2022-07-16 VITALS — BP 112/82 | HR 80 | Ht 66.5 in | Wt 208.0 lb

## 2022-07-16 DIAGNOSIS — K641 Second degree hemorrhoids: Secondary | ICD-10-CM | POA: Diagnosis not present

## 2022-07-16 DIAGNOSIS — K921 Melena: Secondary | ICD-10-CM

## 2022-07-16 MED ORDER — NA SULFATE-K SULFATE-MG SULF 17.5-3.13-1.6 GM/177ML PO SOLN: 1.0000 | Freq: Once | ORAL | 0 refills | Status: AC

## 2022-07-16 NOTE — Patient Instructions (Signed)
If you are age 31 or older, your body mass index should be between 23-30. Your Body mass index is 33.07 kg/m. If this is out of the aforementioned range listed, please consider follow up with your Primary Care Provider.  If you are age 50 or younger, your body mass index should be between 19-25. Your Body mass index is 33.07 kg/m. If this is out of the aformentioned range listed, please consider follow up with your Primary Care Provider.   We have sent a referral to [facility] and they will contact you to make an appointment.  You have been scheduled for a colonoscopy. Please follow written instructions given to you at your visit today.  Please pick up your prep supplies at the pharmacy within the next 1-3 days. If you use inhalers (even only as needed), please bring them with you on the day of your procedure.   The [facility] providers would like to encourage you to use [facility] to communicate with providers for non-urgent requests or questions.  Due to long hold times on the telephone, sending your provider a message by [facility] may be a faster and more efficient way to get a response.  Please allow 48 business hours for a response.  Please remember that this is for non-urgent requests.   It was a pleasure to see you today!  Thank you for trusting me with your gastrointestinal care!    Scott E.cunningham

## 2022-07-16 NOTE — Progress Notes (Signed)
HPI : Judy Skinner is a very pleasant 32 year old female with a history of anxiety and depression who is referred to [place] by Vena Rua, FNP for further evaluation and management of chronic symptomatic hemorrhoids. The patient states that she for started having symptoms from her hemorrhoids in 2018 she underwent hemorrhoid band ligation (believes 3 sessions), but did not experience much improvement initially.  Additionally, she did not tolerate the banding very well and felt it was very uncomfortable.  Her symptoms resolved eventually anyway, and were not a problem again until she started a new job in 2022.  She was sitting down all day at this new job, and she started having recurrent hemorrhoid symptoms.  Her symptoms consist of bright red blood per rectum, sometimes with passage of clots, along with perianal pain and discomfort.  This discomfort described as a dull pressure sensation.  She experiences prolapse of her hemorrhoids, which reduced spontaneously.  She denies any sharp, severe pain with passage of stool.  She does see blood with most of her bowel movements now.  She denies any seepage or incontinence.  No perianal drainage.  She reports regular bowel movements, typically once per day.  Her stools are typically soft and formed.  She denies straining or difficulty with evacuation.  She denies hard stools.  She states that she is on the toilet for less than 5 minutes during bowel movements.  Diarrhea is not a problem for her.  She denies abdominal pain.  She has used hemorrhoid creams and suppositories in the past, which do provide temporary relief.  Past Medical History:  Diagnosis Date   Anemia    Anxiety    Depression    GERD (gastroesophageal reflux disease)    Hemorrhoids      Past Surgical History:  Procedure Laterality Date   pluredisis Right 2021   Family History  Problem Relation Age of Onset   Multiple sclerosis Mother    Lupus Mother    Fibromyalgia  Mother    Breast cancer Maternal Grandmother    Breast cancer Paternal Grandmother    Colon polyps Paternal Grandmother    Diabetes Paternal Grandmother    Colon cancer Neg Hx    Cervical cancer Neg Hx    Stomach cancer Neg Hx    Esophageal cancer Neg Hx    Pancreatic cancer Neg Hx    Social History   Tobacco Use   Smoking status: Never   Smokeless tobacco: Never  Vaping Use   Vaping Use: Never used  Substance Use Topics   Alcohol use: Not Currently   Drug use: Not Currently   Current Outpatient Medications  Medication Sig Dispense Refill   ASHWAGANDHA PO Take by mouth.     B Complex Vitamins (VITAMIN B-COMPLEX PO) Take by mouth.     cholecalciferol (VITAMIN D3) 25 MCG (1000 UNIT) tablet Take 1,000 Units by mouth daily.     Omega-3 Fatty Acids (FISH OIL CONCENTRATE PO) Take by mouth.     No current facility-administered medications for this visit.   No Known Allergies   Review of Systems: All systems reviewed and negative except where noted in HPI.    No results found.  Physical Exam: BP 112/82   Pulse 80   Ht 5' 6.5" (1.689 m)   Wt 208 lb (94.3 kg)   SpO2 98%   BMI 33.07 kg/m  Constitutional: Pleasant,well-developed, female in no acute distress. HEENT: Normocephalic and atraumatic. Conjunctivae are normal. No scleral icterus. Cardiovascular: Normal rate,  regular rhythm.  Pulmonary/chest: Effort normal and breath sounds normal. No wheezing, rales or rhonchi. Abdominal: Soft, nondistended, nontender. Bowel sounds active throughout. There are no masses palpable. No hepatomegaly. Extremities: no edema Rectal: Deferred until time of colonoscopy Neurological: Alert and oriented to person place and time. Skin: Skin is warm and dry. No rashes noted. Psychiatric: Normal mood and affect. Behavior is normal.  CBC    Component Value Date/Time   WBC 4.8 04/08/2022 1158   WBC 5.2 12/16/2020 1519   RBC 4.17 04/08/2022 1158   RBC 4.12 12/16/2020 1519   HGB 11.5  04/08/2022 1158   HCT 36.1 04/08/2022 1158   PLT 273 04/08/2022 1158   MCV 87 04/08/2022 1158   MCH 27.6 04/08/2022 1158   MCH 30.3 12/16/2020 1519   MCHC 31.9 04/08/2022 1158   MCHC 32.5 12/16/2020 1519   RDW 15.7 (H) 04/08/2022 1158   LYMPHSABS 2.1 12/16/2020 1519   MONOABS 0.4 12/16/2020 1519   EOSABS 0.1 12/16/2020 1519   BASOSABS 0.0 12/16/2020 1519    CMP     Component Value Date/Time   NA 138 12/16/2020 1519   K 4.3 12/16/2020 1519   CL 105 12/16/2020 1519   CO2 26 12/16/2020 1519   GLUCOSE 89 12/16/2020 1519   BUN 12 12/16/2020 1519   CREATININE 0.76 12/16/2020 1519   CALCIUM 9.4 12/16/2020 1519   PROT 6.9 12/16/2020 1519   ALBUMIN 3.9 12/16/2020 1519   AST 17 12/16/2020 1519   ALT 14 12/16/2020 1519   ALKPHOS 62 12/16/2020 1519   BILITOT 0.9 12/16/2020 1519   GFRNONAA >60 12/16/2020 1519     ASSESSMENT AND PLAN: 32 year old female with chronic symptoms of hematochezia and perianal pain and discomfort, most consistent with grade 2 hemorrhoids.  She has undergone banding previously with no significant improvement in her symptoms.  We discussed the anatomy and pathophysiology of hemorrhoids and the principles of hemorrhoid management.  It appears that her bowel habits are pretty well optimized.  It is unlikely that fiber supplementation would improve her symptoms much.  Given that hemorrhoid banding has already been attempted and was unsuccessful, and not well-tolerated, I think it is unlikely that repeat banding would provide a different result.  I think a colorectal surgeon may be able to provide other alternatives such as banding under direct visualization versus hemorrhoidectomy. Given her refractory bleeding, as well as reported passage of clots, I think a colonoscopy to definitively exclude other sources of hematochezia is warranted.  Hematochezia, presumed hemorrhoidal - Colonoscopy - Colorectal surgery referral for consideration of hemorrhoidectomy versus other  hemorrhoid treatment  The details, risks (including bleeding, perforation, infection, missed lesions, medication reactions and possible hospitalization or surgery if complications occur), benefits, and alternatives to colonoscopy with possible biopsy and possible polypectomy were discussed with the patient and she consents to proceed.   Takya Vandivier E. Candis Schatz, MD [facility]  [place], Dewaine Conger, FNP

## 2022-08-12 ENCOUNTER — Ambulatory Visit: Payer: Self-pay | Admitting: General Surgery

## 2022-08-12 DIAGNOSIS — K641 Second degree hemorrhoids: Secondary | ICD-10-CM | POA: Diagnosis not present

## 2022-08-12 NOTE — H&P (Signed)
REFERRING PHYSICIAN:  Patrice Paradise*   PROVIDER:  Monico Blitz, MD   MRN: [id number] DOB: 07-08-1990 DATE OF ENCOUNTER: 08/12/2022   Subjective   Chief Complaint: New Consultation and Hemorrhoids       History of Present Illness: Judy Skinner is a 32 y.o. female who is seen today as an office consultation at the request of Dr. Candis Schatz for evaluation of New Consultation and Hemorrhoids .  Patient states she has had symptoms from her hemorrhoids since 2018.  At that time she underwent multiple sessions of hemorrhoid band ligation.  She did not experience much improvement and she did not tolerate the banding very well due to discomfort.  Her symptoms eventually resolved and were not a problem until she started a job where she was sitting down all day in 2022.  She began having recurrent symptoms.  This consist mostly of bright red blood per rectum as well as perianal discomfort.  She notes hemorrhoid prolapse which reduces spontaneously.  She reports regular bowel habits and denies straining.  She was seen by Dr. Candis Schatz and a colonoscopy and evaluation by colorectal surgery was recommended.  Colonoscopy is scheduled for 3/18.     Review of Systems: A complete review of systems was obtained from the patient.  I have reviewed this information and discussed as appropriate with the patient.  See HPI as well for other ROS.     Medical History: Past Medical History Past Medical History: Diagnosis Date  Anemia    Anxiety    GERD (gastroesophageal reflux disease)        There is no problem list on file for this patient.     Past Surgical History Past Surgical History: Procedure Laterality Date  endoscopy surgery      INSTILLATION AGENT FOR PLEURODESIS          Allergies No Known Allergies    Current Outpatient Medications on File Prior to Visit Medication Sig Dispense Refill  sodium, potassium, and magnesium (SUPREP) oral solution TAKE 1 KIT BY  MOUTH ONCE FOR 1 DOSE      cholecalciferol (VITAMIN D3) 1000 unit tablet Take by mouth      omega-3 fatty acids-fish oil 300-1,000 mg capsule Take by mouth       No current facility-administered medications on file prior to visit.     Family History Family History Problem Relation Age of Onset  Deep vein thrombosis (DVT or abnormal blood clot formation) Mother    Hyperlipidemia (Elevated cholesterol) Mother        Social History   Tobacco Use Smoking Status Former  Types: Cigarettes Smokeless Tobacco Never     Social History Social History    Socioeconomic History  Marital status: Single Tobacco Use  Smoking status: Former     Types: Cigarettes  Smokeless tobacco: Never Substance and Sexual Activity  Alcohol use: Not Currently  Drug use: Never      Objective:     Vitals:   08/12/22 1138 BP: (!) 136/91 Pulse: 106 Temp: 36.1 C (97 F) SpO2: 98% Weight: 96.6 kg (213 lb) Height: 167.6 cm ('5\' 6"'$ )     Exam Gen: NAD Abd: soft Rectal: Good rectal tone.  No external hemorrhoids noted.  Spasm noted to palpation in proximal anal canal.     Labs, Imaging and Diagnostic Testing:   Procedure: Anoscopy Surgeon: Marcello Moores After the risks and benefits were explained, written consent was obtained for above procedure.  A medical assistant chaperone was present thoroughout  the entire procedure.  Anesthesia: none Diagnosis: hemorrhoids Findings: Very large, inflamed grade 2 hemorrhoids at all 3 locations.   Assessment and Plan: Diagnoses and all orders for this visit:   Grade II hemorrhoids   32 year old female who presents to the office for evaluation of bleeding hemorrhoids.  On exam today she has 3 very large internal hemorrhoids without any external disease.  They do not appear amenable to rubber band ligation due to size.  I think she would be a good candidate for trans hemorrhoidal dearterialization.  We discussed this in detail.  We also discussed traditional  hemorrhoidectomy as an alternative.  All questions were answered.  Risk include bleeding, pain, recurrence and possible temporary urinary retention.   Rosario Adie, MD Colon and Rectal Surgery [facility]

## 2022-08-26 ENCOUNTER — Ambulatory Visit (AMBULATORY_SURGERY_CENTER): Payer: Commercial Managed Care - HMO | Admitting: Gastroenterology

## 2022-08-26 ENCOUNTER — Encounter: Payer: Self-pay | Admitting: Gastroenterology

## 2022-08-26 VITALS — BP 102/67 | HR 72 | Temp 98.1°F | Resp 12 | Ht 66.0 in | Wt 208.0 lb

## 2022-08-26 DIAGNOSIS — K921 Melena: Secondary | ICD-10-CM

## 2022-08-26 DIAGNOSIS — D125 Benign neoplasm of sigmoid colon: Secondary | ICD-10-CM | POA: Diagnosis not present

## 2022-08-26 DIAGNOSIS — D123 Benign neoplasm of transverse colon: Secondary | ICD-10-CM

## 2022-08-26 DIAGNOSIS — F419 Anxiety disorder, unspecified: Secondary | ICD-10-CM | POA: Diagnosis not present

## 2022-08-26 DIAGNOSIS — D124 Benign neoplasm of descending colon: Secondary | ICD-10-CM

## 2022-08-26 DIAGNOSIS — K635 Polyp of colon: Secondary | ICD-10-CM | POA: Diagnosis not present

## 2022-08-26 DIAGNOSIS — F32A Depression, unspecified: Secondary | ICD-10-CM | POA: Diagnosis not present

## 2022-08-26 MED ORDER — SODIUM CHLORIDE 0.9 % IV SOLN: 500.0000 mL | INTRAVENOUS | Status: DC

## 2022-08-26 MED ORDER — SODIUM CHLORIDE 0.9 % IV SOLN: 500.0000 mL | Freq: Once | INTRAVENOUS | Status: DC

## 2022-08-26 NOTE — Patient Instructions (Signed)
YOU HAD AN ENDOSCOPIC PROCEDURE TODAY AT THE [facility]:   Refer to the procedure report that was given to you for any specific questions about what was found during the examination.  If the procedure report does not answer your questions, please call your gastroenterologist to clarify.  If you requested that your care partner not be given the details of your procedure findings, then the procedure report has been included in a sealed envelope for you to review at your convenience later.  YOU SHOULD EXPECT: Some feelings of bloating in the abdomen. Passage of more gas than usual.  Walking can help get rid of the air that was put into your GI tract during the procedure and reduce the bloating. If you had a lower endoscopy (such as a colonoscopy or flexible sigmoidoscopy) you may notice spotting of blood in your stool or on the toilet paper. If you underwent a bowel prep for your procedure, you may not have a normal bowel movement for a few days.  Please Note:  You might notice some irritation and congestion in your nose or some drainage.  This is from the oxygen used during your procedure.  There is no need for concern and it should clear up in a day or so.  SYMPTOMS TO REPORT IMMEDIATELY:  Following lower endoscopy (colonoscopy or flexible sigmoidoscopy):  Excessive amounts of blood in the stool  Significant tenderness or worsening of abdominal pains  Swelling of the abdomen that is new, acute  Fever of 100F or higher  For urgent or emergent issues, a gastroenterologist can be reached at any hour by calling [phone]. Do not use MyChart messaging for urgent concerns.    DIET:  We do recommend a small meal at first, but then you may proceed to your regular diet.  Drink plenty of fluids but you should avoid alcoholic beverages for 24 hours.  ACTIVITY:  You should plan to take it easy for the rest of today and you should NOT DRIVE or use heavy machinery until tomorrow (because of  the sedation medicines used during the test).    FOLLOW UP: Our staff will call the number listed on your records the next business day following your procedure.  We will call around 7:15- 8:00 am to check on you and address any questions or concerns that you may have regarding the information given to you following your procedure. If we do not reach you, we will leave a message.     If any biopsies were taken you will be contacted by phone or by letter within the next 1-3 weeks.  Please call us at [phone] if you have not heard about the biopsies in 3 weeks.    SIGNATURES/CONFIDENTIALITY: You and/or your care partner have signed paperwork which will be entered into your electronic medical record.  These signatures attest to the fact that that the information above on your After Visit Summary has been reviewed and is understood.  Full responsibility of the confidentiality of this discharge information lies with you and/or your care-partner.  

## 2022-08-26 NOTE — Op Note (Signed)
[facility] Patient Name: Judy Skinner Procedure Date: 08/26/2022 8:28 AM MRN: [id number] Endoscopist: Nicki Reaper E. Candis Schatz , MD, [id number] Age: 32 Referring MD:  Date of Birth: 04-07-1991 Gender: Female Account #: [id number] Procedure:                Colonoscopy Indications:              Hematochezia Medicines:                Monitored Anesthesia Care Procedure:                Pre-Anesthesia Assessment:                           - Prior to the procedure, a History and Physical                            was performed, and patient medications and                            allergies were reviewed. The patient's tolerance of                            previous anesthesia was also reviewed. The risks                            and benefits of the procedure and the sedation                            options and risks were discussed with the patient.                            All questions were answered, and informed consent                            was obtained. Prior Anticoagulants: The patient has                            taken no anticoagulant or antiplatelet agents. ASA                            Grade Assessment: II - A patient with mild systemic                            disease. After reviewing the risks and benefits,                            the patient was deemed in satisfactory condition to                            undergo the procedure.                           After obtaining informed consent, the colonoscope  was passed under direct vision. Throughout the                            procedure, the patient's blood pressure, pulse, and                            oxygen saturations were monitored continuously. The                            CF [id number] was introduced through the anus                            and advanced to the the terminal ileum, with                            identification of the appendiceal  orifice and IC                            valve. The colonoscopy was performed without                            difficulty. The patient tolerated the procedure                            well. The quality of the bowel preparation was                            good. The terminal ileum, ileocecal valve,                            appendiceal orifice, and rectum were photographed.                            The bowel preparation used was SUPREP via split                            dose instruction. Scope In: 8:43:39 AM Scope Out: 9:06:45 AM Scope Withdrawal Time: 0 hours 18 minutes 52 seconds  Total Procedure Duration: 0 hours 23 minutes 6 seconds  Findings:                 The perianal and digital rectal examinations were                            normal. Pertinent negatives include normal                            sphincter tone and no palpable rectal lesions.                           An 11 mm polyp was found in the hepatic flexure.                            The polyp was sessile. The polyp was removed with  a                            cold snare. Resection and retrieval were complete.                            Estimated blood loss was minimal.                           A 7 mm polyp was found in the descending colon. The                            polyp was sessile. The polyp was removed with a                            cold snare. Resection and retrieval were complete.                            Estimated blood loss was minimal.                           A 5 mm polyp was found in the sigmoid colon. The                            polyp was sessile. The polyp was removed with a                            cold snare. Resection and retrieval were complete.                            Estimated blood loss was minimal.                           The exam was otherwise normal throughout the                            examined colon.                           The terminal ileum appeared  normal.                           Non-bleeding internal hemorrhoids were found during                            retroflexion. The hemorrhoids were large.                           No additional abnormalities were found on                            retroflexion. Complications:            No immediate complications. Estimated Blood Loss:     Estimated blood loss was minimal. Impression:               -  One 11 mm polyp at the hepatic flexure, removed                            with a cold snare. Resected and retrieved.                           - One 7 mm polyp in the descending colon, removed                            with a cold snare. Resected and retrieved.                           - One 5 mm polyp in the sigmoid colon, removed with                            a cold snare. Resected and retrieved.                           - The examined portion of the ileum was normal.                           - Non-bleeding internal hemorrhoids. This is the                            source of the patient's hematochezia Recommendation:           - Patient has a contact number available for                            emergencies. The signs and symptoms of potential                            delayed complications were discussed with the                            patient. Return to normal activities tomorrow.                            Written discharge instructions were provided to the                            patient.                           - Resume previous diet.                           - Continue present medications.                           - Await pathology results.                           - Repeat colonoscopy (date not yet determined) for  surveillance based on pathology results.                           - Follow up with surgery for further hemorrhoid                            therapy Yasmin Bronaugh E. Candis Schatz, MD 08/26/2022 9:12:12 AM This report has been  signed electronically.

## 2022-08-26 NOTE — Progress Notes (Signed)
Called to room to assist during endoscopic procedure.  Patient ID and intended procedure confirmed with present staff. Received instructions for my participation in the procedure from the performing physician.  

## 2022-08-26 NOTE — Progress Notes (Signed)
[facility] History and Physical   Primary Care Physician:  Renee Rival, FNP   Reason for Procedure:   Hematochezia  Plan:    Colonoscopy     HPI: Judy Skinner is a 32 y.o. female undergoing colonoscopy to evaluate chronic hematochezia persisting after hemorrhoid banding.  She has no family history of colon cancer.    Past Medical History:  Diagnosis Date   Anemia    Anxiety    Depression    GERD (gastroesophageal reflux disease)    Hemorrhoids     Past Surgical History:  Procedure Laterality Date   pluredisis Right 2021    Prior to Admission medications   Medication Sig Start Date End Date Taking? Authorizing Provider  B Complex Vitamins (VITAMIN B-COMPLEX PO) Take by mouth.   Yes [provider]  cholecalciferol (VITAMIN D3) 25 MCG (1000 UNIT) tablet Take 1,000 Units by mouth daily.   Yes [provider]  Omega-3 Fatty Acids (FISH OIL CONCENTRATE PO) Take by mouth.   Yes [provider]  ASHWAGANDHA PO Take by mouth.    [provider]    Current Outpatient Medications  Medication Sig Dispense Refill   B Complex Vitamins (VITAMIN B-COMPLEX PO) Take by mouth.     cholecalciferol (VITAMIN D3) 25 MCG (1000 UNIT) tablet Take 1,000 Units by mouth daily.     Omega-3 Fatty Acids (FISH OIL CONCENTRATE PO) Take by mouth.     ASHWAGANDHA PO Take by mouth.     Current Facility-Administered Medications  Medication Dose Route Frequency Provider Last Rate Last Admin   0.9 %  sodium chloride infusion  500 mL Intravenous Continuous Dustin Flock E, MD       0.9 %  sodium chloride infusion  500 mL Intravenous Once Daryel November, MD        Allergies as of 08/26/2022   (No Known Allergies)    Family History  Problem Relation Age of Onset   Multiple sclerosis Mother    Lupus Mother    Fibromyalgia Mother    Breast cancer Maternal Grandmother    Breast cancer Paternal Grandmother    Colon polyps  Paternal Grandmother    Diabetes Paternal Grandmother    Colon cancer Neg Hx    Cervical cancer Neg Hx    Stomach cancer Neg Hx    Esophageal cancer Neg Hx    Pancreatic cancer Neg Hx    Rectal cancer Neg Hx     Social History   Socioeconomic History   Marital status: Single    Spouse name: Not on file   Number of children: 1   Years of education: Not on file   Highest education level: Not on file  Occupational History   Not on file  Tobacco Use   Smoking status: Never   Smokeless tobacco: Never  Vaping Use   Vaping Use: Never used  Substance and Sexual Activity   Alcohol use: Not Currently   Drug use: Not Currently   Sexual activity: Not Currently  Other Topics Concern   Not on file  Social History Narrative   Lives by herself    Social Determinants of Health   Financial Resource Strain: Not on file  Food Insecurity: Not on file  Transportation Needs: Not on file  Physical Activity: Not on file  Stress: Not on file  Social Connections: Not on file  Intimate Partner Violence: Not on file    Review of Systems:  All other review of systems  negative except as mentioned in the HPI.  Physical Exam: Vital signs BP 112/75   Pulse 79   Temp 98.1 F (36.7 C)   Resp 11   Ht 5\' 6"  (1.676 m)   Wt 208 lb (94.3 kg)   LMP 08/15/2022 (Exact Date)   SpO2 97%   BMI 33.57 kg/m   General:   Alert,  Well-developed, well-nourished, pleasant and cooperative in NAD Airway:  Mallampati 1 Lungs:  Clear throughout to auscultation.   Heart:  Regular rate and rhythm; no murmurs, clicks, rubs,  or gallops. Abdomen:  Soft, nontender and nondistended. Normal bowel sounds.   Neuro/Psych:  Normal mood and affect. A and O x 3   Cornell Gaber E. Candis Schatz, MD [facility]

## 2022-08-26 NOTE — Progress Notes (Signed)
Pt's states no medical or surgical changes since previsit or office visit. 

## 2022-08-26 NOTE — Progress Notes (Signed)
Uneventful anesthetic. Report to pacu rn. Vss. Care resumed by rn. 

## 2022-08-27 ENCOUNTER — Telehealth: Payer: Self-pay | Admitting: *Deleted

## 2022-08-27 NOTE — Telephone Encounter (Signed)
  Follow up Call-     08/26/2022    8:07 AM  Call back number  Post procedure Call Back phone  # [phone]  Permission to leave phone message Yes     Patient questions:  Do you have a fever, pain , or abdominal swelling? No. Pain Score  0 *  Have you tolerated food without any problems? Yes.    Have you been able to return to your normal activities? Yes.    Do you have any questions about your discharge instructions: Diet   No. Medications  No. Follow up visit  No.  Do you have questions or concerns about your Care? No.  Actions: * If pain score is 4 or above: No action needed, pain <4.

## 2022-09-01 NOTE — Progress Notes (Signed)
Judy Skinner,   Two of the three polyps that I removed during your recent procedure were completely benign but were proven to be "pre-cancerous" polyps that MAY have grown into cancers if they had not been removed.  Studies shows that at least 20% of women over age 31 and 30% of men over age 17 have pre-cancerous polyps.  Because one of the precancerous polyps was over 1 cm, based on current nationally recognized surveillance guidelines, I recommend that you have a repeat colonoscopy in 3 years.

## 2022-09-04 ENCOUNTER — Encounter (HOSPITAL_BASED_OUTPATIENT_CLINIC_OR_DEPARTMENT_OTHER): Payer: Self-pay | Admitting: General Surgery

## 2022-09-04 ENCOUNTER — Other Ambulatory Visit: Payer: Self-pay

## 2022-09-04 NOTE — Progress Notes (Signed)
Spoke w/ via phone for pre-op interview: patient Lab needs dos: UPT Lab results: NA COVID test: patient states asymptomatic no test needed. Arrive at [place] 09/12/22. NPO after MN except clear liquids.Clear liquids from MN until 0430. Med rec completed. Medications to take morning of surgery: none Diabetic medication: NA Patient instructed no nail polish to be worn day of surgery unless it is clear. Patient instructed to bring photo id and insurance card day of surgery. Patient aware to have Driver (ride ) / caregiver for 24 hours after surgery. (Mother to drive) Patient Special Instructions: NA Pre-Op special Istructions: NA Patient verbalized understanding of instructions that were given at this phone interview. Patient denies shortness of breath, chest pain, fever, cough at this phone interview.

## 2022-09-11 NOTE — Anesthesia Preprocedure Evaluation (Signed)
Anesthesia Evaluation  Patient identified by MRN, date of birth, ID band Patient awake    Reviewed: Allergy & Precautions, H&P , NPO status , Patient's Chart, lab work & pertinent test results  Airway Mallampati: I  TM Distance: >3 FB Neck ROM: Full    Dental no notable dental hx. (+) Teeth Intact, Dental Advisory Given   Pulmonary former smoker   Pulmonary exam normal breath sounds clear to auscultation       Cardiovascular Exercise Tolerance: Good negative cardio ROS Normal cardiovascular exam Rhythm:Regular Rate:Normal     Neuro/Psych  PSYCHIATRIC DISORDERS Anxiety Depression    negative neurological ROS  negative psych ROS   GI/Hepatic negative GI ROS, Neg liver ROS,GERD  Medicated,,  Endo/Other  negative endocrine ROS    Renal/GU negative Renal ROS  negative genitourinary   Musculoskeletal negative musculoskeletal ROS (+)    Abdominal   Peds negative pediatric ROS (+)  Hematology negative hematology ROS (+) Blood dyscrasia, anemia   Anesthesia Other Findings   Reproductive/Obstetrics negative OB ROS                              Anesthesia Physical Anesthesia Plan  ASA: 2  Anesthesia Plan: MAC   Post-op Pain Management: Celebrex PO (pre-op)* and Tylenol PO (pre-op)*   Induction:   PONV Risk Score and Plan: 3 and Ondansetron, Dexamethasone and Treatment may vary due to age or medical condition  Airway Management Planned: Natural Airway, Simple Face Mask and Mask  Additional Equipment: None  Intra-op Plan:   Post-operative Plan:   Informed Consent: I have reviewed the patients History and Physical, chart, labs and discussed the procedure including the risks, benefits and alternatives for the proposed anesthesia with the patient or authorized representative who has indicated his/her understanding and acceptance.       Plan Discussed with: Anesthesiologist and  CRNA  Anesthesia Plan Comments:         Anesthesia Quick Evaluation

## 2022-09-12 ENCOUNTER — Ambulatory Visit (HOSPITAL_BASED_OUTPATIENT_CLINIC_OR_DEPARTMENT_OTHER): Payer: Commercial Managed Care - HMO | Admitting: Anesthesiology

## 2022-09-12 ENCOUNTER — Encounter (HOSPITAL_BASED_OUTPATIENT_CLINIC_OR_DEPARTMENT_OTHER)
Admission: RE | Disposition: A | Discharge status: Home / Self Care | Payer: Self-pay | Source: Ambulatory Visit | Attending: General Surgery

## 2022-09-12 ENCOUNTER — Encounter (HOSPITAL_BASED_OUTPATIENT_CLINIC_OR_DEPARTMENT_OTHER): Payer: Self-pay | Admitting: General Surgery

## 2022-09-12 ENCOUNTER — Other Ambulatory Visit: Payer: Self-pay

## 2022-09-12 ENCOUNTER — Ambulatory Visit (HOSPITAL_BASED_OUTPATIENT_CLINIC_OR_DEPARTMENT_OTHER)
Admission: RE | Admit: 2022-09-12 | Discharge: 2022-09-12 | Disposition: A | Discharge status: Home / Self Care | Payer: Commercial Managed Care - HMO | Source: Ambulatory Visit | Attending: General Surgery | Admitting: General Surgery

## 2022-09-12 DIAGNOSIS — Z09 Encounter for follow-up examination after completed treatment for conditions other than malignant neoplasm: Secondary | ICD-10-CM | POA: Insufficient documentation

## 2022-09-12 DIAGNOSIS — K641 Second degree hemorrhoids: Secondary | ICD-10-CM | POA: Diagnosis present

## 2022-09-12 DIAGNOSIS — K625 Hemorrhage of anus and rectum: Secondary | ICD-10-CM | POA: Diagnosis not present

## 2022-09-12 DIAGNOSIS — F418 Other specified anxiety disorders: Secondary | ICD-10-CM

## 2022-09-12 DIAGNOSIS — D649 Anemia, unspecified: Secondary | ICD-10-CM

## 2022-09-12 DIAGNOSIS — K642 Third degree hemorrhoids: Secondary | ICD-10-CM | POA: Diagnosis not present

## 2022-09-12 DIAGNOSIS — Z87891 Personal history of nicotine dependence: Secondary | ICD-10-CM

## 2022-09-12 DIAGNOSIS — Z01818 Encounter for other preprocedural examination: Secondary | ICD-10-CM

## 2022-09-12 DIAGNOSIS — K219 Gastro-esophageal reflux disease without esophagitis: Secondary | ICD-10-CM | POA: Diagnosis not present

## 2022-09-12 HISTORY — PX: TRANSANAL HEMORRHOIDAL DEARTERIALIZATION: SHX6136

## 2022-09-12 HISTORY — DX: Pneumothorax, unspecified: J93.9

## 2022-09-12 LAB — POCT PREGNANCY, URINE: Preg Test, Ur: NEGATIVE

## 2022-09-12 SURGERY — TRANSANAL HEMORRHOIDAL DEARTERIALIZATION
Anesthesia: Monitor Anesthesia Care

## 2022-09-12 MED ORDER — ONDANSETRON HCL 4 MG/2ML IJ SOLN: 4.0000 mg | Freq: Once | INTRAMUSCULAR | Status: DC | PRN

## 2022-09-12 MED ORDER — SODIUM CHLORIDE 0.9% FLUSH: 3.0000 mL | INTRAVENOUS | Status: DC | PRN

## 2022-09-12 MED ORDER — PROPOFOL 500 MG/50ML IV EMUL
INTRAVENOUS | Status: AC
  Filled 2022-09-12: qty 50

## 2022-09-12 MED ORDER — DEXAMETHASONE SODIUM PHOSPHATE 10 MG/ML IJ SOLN
INTRAMUSCULAR | Status: DC | PRN
  Administered 2022-09-12: 8 mg via INTRAVENOUS

## 2022-09-12 MED ORDER — BUPIVACAINE LIPOSOME 1.3 % IJ SUSP: 20.0000 mL | Freq: Once | INTRAMUSCULAR | Status: DC

## 2022-09-12 MED ORDER — SODIUM CHLORIDE 0.9% FLUSH: 3.0000 mL | Freq: Two times a day (BID) | INTRAVENOUS | Status: DC

## 2022-09-12 MED ORDER — MIDAZOLAM HCL 2 MG/2ML IJ SOLN
INTRAMUSCULAR | Status: DC | PRN
  Administered 2022-09-12: 2 mg via INTRAVENOUS

## 2022-09-12 MED ORDER — GABAPENTIN 300 MG PO CAPS
ORAL_CAPSULE | ORAL | Status: AC
  Filled 2022-09-12: qty 1

## 2022-09-12 MED ORDER — ACETAMINOPHEN 500 MG PO TABS: 1000.0000 mg | ORAL_TABLET | Freq: Once | ORAL | Status: DC

## 2022-09-12 MED ORDER — DEXMEDETOMIDINE HCL IN NACL 80 MCG/20ML IV SOLN
INTRAVENOUS | Status: DC | PRN
  Administered 2022-09-12: 4 ug via BUCCAL

## 2022-09-12 MED ORDER — FENTANYL CITRATE (PF) 100 MCG/2ML IJ SOLN
INTRAMUSCULAR | Status: AC
  Filled 2022-09-12: qty 2

## 2022-09-12 MED ORDER — MIDAZOLAM HCL 2 MG/2ML IJ SOLN
INTRAMUSCULAR | Status: AC
  Filled 2022-09-12: qty 2

## 2022-09-12 MED ORDER — ACETAMINOPHEN 325 MG PO TABS: 650.0000 mg | ORAL_TABLET | ORAL | Status: DC | PRN

## 2022-09-12 MED ORDER — PROPOFOL 10 MG/ML IV BOLUS
INTRAVENOUS | Status: DC | PRN
  Administered 2022-09-12: 20 mg via INTRAVENOUS

## 2022-09-12 MED ORDER — CELECOXIB 200 MG PO CAPS
ORAL_CAPSULE | ORAL | Status: AC
  Filled 2022-09-12: qty 1

## 2022-09-12 MED ORDER — BUPIVACAINE HCL (PF) 0.5 % IJ SOLN
INTRAMUSCULAR | Status: DC | PRN
  Administered 2022-09-12: 30 mL

## 2022-09-12 MED ORDER — PROPOFOL 500 MG/50ML IV EMUL
INTRAVENOUS | Status: DC | PRN
  Administered 2022-09-12: 150 ug/kg/min via INTRAVENOUS

## 2022-09-12 MED ORDER — OXYCODONE HCL 5 MG PO TABS: 5.0000 mg | ORAL_TABLET | Freq: Four times a day (QID) | ORAL | 0 refills | Status: AC | PRN

## 2022-09-12 MED ORDER — LACTATED RINGERS IV SOLN
INTRAVENOUS | Status: DC
  Administered 2022-09-12: 1000 mL via INTRAVENOUS

## 2022-09-12 MED ORDER — FENTANYL CITRATE (PF) 100 MCG/2ML IJ SOLN
INTRAMUSCULAR | Status: DC | PRN
  Administered 2022-09-12 (×2): 25 ug via INTRAVENOUS

## 2022-09-12 MED ORDER — BUPIVACAINE LIPOSOME 1.3 % IJ SUSP
INTRAMUSCULAR | Status: DC | PRN
  Administered 2022-09-12: 20 mL

## 2022-09-12 MED ORDER — FENTANYL CITRATE (PF) 100 MCG/2ML IJ SOLN: 25.0000 ug | INTRAMUSCULAR | Status: DC | PRN

## 2022-09-12 MED ORDER — CELECOXIB 200 MG PO CAPS: 200.0000 mg | ORAL_CAPSULE | Freq: Once | ORAL | Status: DC

## 2022-09-12 MED ORDER — MEPERIDINE HCL 25 MG/ML IJ SOLN: 6.2500 mg | INTRAMUSCULAR | Status: DC | PRN

## 2022-09-12 MED ORDER — PROPOFOL 10 MG/ML IV BOLUS
INTRAVENOUS | Status: AC
  Filled 2022-09-12: qty 20

## 2022-09-12 MED ORDER — EPINEPHRINE 1 MG/ML IJ SOLN
INTRAMUSCULAR | Status: DC | PRN
  Administered 2022-09-12: .5 mL via SUBCUTANEOUS

## 2022-09-12 MED ORDER — ACETAMINOPHEN 500 MG PO TABS
ORAL_TABLET | ORAL | Status: AC
  Filled 2022-09-12: qty 2

## 2022-09-12 MED ORDER — OXYCODONE HCL 5 MG PO TABS: 5.0000 mg | ORAL_TABLET | Freq: Once | ORAL | Status: DC | PRN

## 2022-09-12 MED ORDER — SODIUM CHLORIDE 0.9 % IV SOLN: 250.0000 mL | INTRAVENOUS | Status: DC | PRN

## 2022-09-12 MED ORDER — ONDANSETRON HCL 4 MG/2ML IJ SOLN
INTRAMUSCULAR | Status: DC | PRN
  Administered 2022-09-12: 4 mg via INTRAVENOUS

## 2022-09-12 MED ORDER — GABAPENTIN 300 MG PO CAPS
300.0000 mg | ORAL_CAPSULE | ORAL | Status: AC
  Administered 2022-09-12: 300 mg via ORAL

## 2022-09-12 MED ORDER — OXYCODONE HCL 5 MG/5ML PO SOLN: 5.0000 mg | Freq: Once | ORAL | Status: DC | PRN

## 2022-09-12 MED ORDER — OXYCODONE HCL 5 MG PO TABS: 5.0000 mg | ORAL_TABLET | ORAL | Status: DC | PRN

## 2022-09-12 MED ORDER — PROPOFOL 1000 MG/100ML IV EMUL
INTRAVENOUS | Status: AC
  Filled 2022-09-12: qty 100

## 2022-09-12 MED ORDER — ACETAMINOPHEN 325 MG PO TABS: 325.0000 mg | ORAL_TABLET | ORAL | Status: DC | PRN

## 2022-09-12 MED ORDER — ACETAMINOPHEN 325 MG RE SUPP: 650.0000 mg | RECTAL | Status: DC | PRN

## 2022-09-12 MED ORDER — 0.9 % SODIUM CHLORIDE (POUR BTL) OPTIME
TOPICAL | Status: DC | PRN
  Administered 2022-09-12: 500 mL

## 2022-09-12 MED ORDER — CELECOXIB 200 MG PO CAPS
200.0000 mg | ORAL_CAPSULE | ORAL | Status: AC
  Administered 2022-09-12: 200 mg via ORAL

## 2022-09-12 MED ORDER — ACETAMINOPHEN 500 MG PO TABS
1000.0000 mg | ORAL_TABLET | ORAL | Status: AC
  Administered 2022-09-12: 1000 mg via ORAL

## 2022-09-12 MED ORDER — LIDOCAINE HCL (CARDIAC) PF 100 MG/5ML IV SOSY
PREFILLED_SYRINGE | INTRAVENOUS | Status: DC | PRN
  Administered 2022-09-12: 40 mg via INTRAVENOUS

## 2022-09-12 MED ORDER — ACETAMINOPHEN 160 MG/5ML PO SOLN: 325.0000 mg | ORAL | Status: DC | PRN

## 2022-09-12 SURGICAL SUPPLY — 35 items
BRIEF MESH DISP LRG (UNDERPADS AND DIAPERS) IMPLANT
COVER BACK TABLE 60X90IN (DRAPES) ×1 IMPLANT
DRAPE HYSTEROSCOPY (MISCELLANEOUS) ×1 IMPLANT
DRAPE SHEET LG 3/4 BI-LAMINATE (DRAPES) ×1 IMPLANT
ELECT REM PT RETURN 9FT ADLT (ELECTROSURGICAL) ×1
ELECTRODE REM PT RTRN 9FT ADLT (ELECTROSURGICAL) ×1 IMPLANT
GAUZE 4X4 16PLY ~~LOC~~+RFID DBL (SPONGE) ×1 IMPLANT
GAUZE PAD ABD 8X10 STRL (GAUZE/BANDAGES/DRESSINGS) IMPLANT
GAUZE SPONGE 4X4 12PLY STRL (GAUZE/BANDAGES/DRESSINGS) IMPLANT
GLOVE BIO SURGEON STRL SZ 6.5 (GLOVE) ×1 IMPLANT
GLOVE BIOGEL PI IND STRL 7.0 (GLOVE) ×1 IMPLANT
GLOVE INDICATOR 6.5 STRL GRN (GLOVE) ×1 IMPLANT
GOWN STRL REUS W/TWL XL LVL3 (GOWN DISPOSABLE) ×1 IMPLANT
HEMOSTAT SURGICEL 4X8 (HEMOSTASIS) IMPLANT
KIT SIGMOIDOSCOPE (SET/KITS/TRAYS/PACK) IMPLANT
KIT SLIDE ONE PROLAPS HEMORR (KITS) IMPLANT
KIT TURNOVER CYSTO (KITS) ×1 IMPLANT
LEGGING LITHOTOMY PAIR STRL (DRAPES) ×1 IMPLANT
LUBRICANT JELLY K Y 4OZ (MISCELLANEOUS) ×1 IMPLANT
NDL HYPO 22X1.5 SAFETY MO (MISCELLANEOUS) ×1 IMPLANT
NEEDLE HYPO 22X1.5 SAFETY MO (MISCELLANEOUS) ×1 IMPLANT
PACK BASIN DAY SURGERY FS (CUSTOM PROCEDURE TRAY) ×1 IMPLANT
PAD ARMBOARD 7.5X6 YLW CONV (MISCELLANEOUS) IMPLANT
PENCIL SMOKE EVACUATOR (MISCELLANEOUS) ×1 IMPLANT
SLEEVE SCD COMPRESS KNEE MED (STOCKING) ×1 IMPLANT
SPIKE FLUID TRANSFER (MISCELLANEOUS) IMPLANT
SPONGE HEMORRHOID 8X3CM (HEMOSTASIS) IMPLANT
SUT CHROMIC 2 0 SH (SUTURE) IMPLANT
SUT CHROMIC 3 0 SH 27 (SUTURE) IMPLANT
SUT VIC AB 2-0 UR6 27 (SUTURE) IMPLANT
SYR CONTROL 10ML LL (SYRINGE) ×1 IMPLANT
TOWEL OR 17X24 6PK STRL BLUE (TOWEL DISPOSABLE) ×1 IMPLANT
TRAY DSU PREP LF (CUSTOM PROCEDURE TRAY) ×1 IMPLANT
TUBE CONNECTING 12X1/4 (SUCTIONS) ×1 IMPLANT
YANKAUER SUCT BULB TIP NO VENT (SUCTIONS) ×1 IMPLANT

## 2022-09-12 NOTE — Anesthesia Postprocedure Evaluation (Signed)
Anesthesia Post Note  Patient: Judy Skinner  Procedure(s) Performed: TRANSANAL HEMORRHOIDAL DEARTERIALIZATION     Patient location during evaluation: PACU Anesthesia Type: MAC Level of consciousness: awake and alert Pain management: pain level controlled Vital Signs Assessment: post-procedure vital signs reviewed and stable Respiratory status: spontaneous breathing, nonlabored ventilation, respiratory function stable and patient connected to nasal cannula oxygen Cardiovascular status: stable and blood pressure returned to baseline Postop Assessment: no apparent nausea or vomiting Anesthetic complications: no  No notable events documented.  Last Vitals:  Vitals:   09/12/22 0900 09/12/22 0931  BP: 115/63 111/69  Pulse:  78  Resp:  16  Temp:  36.8 C  SpO2: 95% 99%    Last Pain:  Vitals:   09/12/22 0931  TempSrc: Oral  PainSc: 2                  Keng Jewel

## 2022-09-12 NOTE — H&P (Signed)
MRN: [id number] DOB: 1991/04/05    Subjective   Chief Complaint: New Consultation and Hemorrhoids       History of Present Illness: Judy Skinner is a 32 y.o. female who is seen today as an office consultation at the request of Dr. Candis Schatz for evaluation of New Consultation and Hemorrhoids .  Patient states she has had symptoms from her hemorrhoids since 2018.  At that time she underwent multiple sessions of hemorrhoid band ligation.  She did not experience much improvement and she did not tolerate the banding very well due to discomfort.  Her symptoms eventually resolved and were not a problem until she started a job where she was sitting down all day in 2022.  She began having recurrent symptoms.  This consist mostly of bright red blood per rectum as well as perianal discomfort.  She notes hemorrhoid prolapse which reduces spontaneously.  She reports regular bowel habits and denies straining.  She was seen by Dr. Candis Schatz and a colonoscopy and evaluation by colorectal surgery was recommended.  Colonoscopy is scheduled for 3/18.     Review of Systems: A complete review of systems was obtained from the patient.  I have reviewed this information and discussed as appropriate with the patient.  See HPI as well for other ROS.     Medical History: Past Medical History Past Medical History: Diagnosis        Date            Anemia                         Anxiety                         GERD (gastroesophageal reflux disease)              There is no problem list on file for this patient.     Past Surgical History Past Surgical History: Procedure       Laterality         Date            endoscopy surgery                              INSTILLATION AGENT FOR PLEURODESIS                              Allergies No Known Allergies     Current Outpatient Medications on File Prior to Visit Medication       Sig       Dispense         Refill            sodium, potassium, and  magnesium (SUPREP) oral solution          TAKE 1 KIT BY MOUTH ONCE FOR 1 DOSE                              cholecalciferol (VITAMIN D3) 1000 unit tablet           Take by mouth                                     omega-3 fatty  acids-fish oil 300-1,000 mg capsule    Take by mouth                            No current facility-administered medications on file prior to visit.     Family History Family History Problem           Relation           Age of Onset            Deep vein thrombosis (DVT or abnormal blood clot formation)        Mother              Hyperlipidemia (Elevated cholesterol)            Mother         Social History   Tobacco Use Smoking Status           Former            Types: Cigarettes Smokeless Tobacco   Never     Social History Social History     Socioeconomic History            Marital status:  Single Tobacco Use            Smoking status:          Former                         Types: Cigarettes            Smokeless tobacco:    Never Substance and Sexual Activity            Alcohol use:    Not Currently            Drug use:        Never       Objective:     Vitals:   09/12/22 0615  BP: 131/74  Pulse: 66  Resp: 17  Temp: 98.1 F (36.7 C)  SpO2: 99%        Exam Gen: NAD Abd: soft Rectal: Good rectal tone.  No external hemorrhoids noted.  Spasm noted to palpation in proximal anal canal.     Labs, Imaging and Diagnostic Testing:   Procedure: Anoscopy 08/12/2022 Surgeon: Marcello Moores After the risks and benefits were explained, written consent was obtained for above procedure.  A medical assistant chaperone was present thoroughout the entire procedure.  Anesthesia: none Diagnosis: hemorrhoids Findings: Very large, inflamed grade 2 hemorrhoids at all 3 locations.   Assessment and Plan: Diagnoses and all orders for this visit:   Grade II hemorrhoids   32 year old female who presents to the office for evaluation of bleeding hemorrhoids.   On exam today she has 3 very large internal hemorrhoids without any external disease.  They do not appear amenable to rubber band ligation due to size.  I think she would be a good candidate for trans hemorrhoidal dearterialization.  We discussed this in detail.  We also discussed traditional hemorrhoidectomy as an alternative.  All questions were answered.  Risk include bleeding, pain, recurrence and possible temporary urinary retention.   Rosario Adie, MD Colon and Rectal Surgery [facility]

## 2022-09-12 NOTE — Discharge Instructions (Addendum)
ANORECTAL SURGERY: POST OP INSTRUCTIONS Take your usually prescribed home medications unless otherwise directed. DIET: During the first few hours after surgery sip on some liquids until you are able to urinate.  It is normal to not urinate for several hours after this surgery.  If you feel uncomfortable, please contact the office for instructions.  After you are able to urinate,you may eat, if you feel like it.  Follow a light bland diet the first 24 hours after arrival home, such as soup, liquids, crackers, etc.  Be sure to include lots of fluids daily (6-8 glasses).  Avoid fast food or heavy meals, as your are more likely to get nauseated.  Eat a low fat diet the next few days after surgery.  Limit caffeine intake to 1-2 servings a day. PAIN CONTROL: Pain is best controlled by a usual combination of several different methods TOGETHER: Muscle relaxation: Soak in a warm bath (or Sitz bath) three times a day and after bowel movements.  Continue to do this until all pain is resolved. Over the counter pain medication Prescription pain medication Most patients will experience some swelling and discomfort in the anus/rectal area and incisions.  Heat such as warm towels, sitz baths, warm baths, etc to help relax tight/sore spots and speed recovery.  Some people prefer to use ice, especially in the first couple days after surgery, as it may decrease the pain and swelling, or alternate between ice & heat.  Experiment to what works for you.  Swelling and bruising can take several weeks to resolve.  Pain can take even longer to completely resolve. It is helpful to take an over-the-counter pain medication regularly for the first few weeks.  Choose one of the following that works best for you: Naproxen (Aleve, etc)  Two 220mg tabs twice a day Ibuprofen (Advil, etc) Three 200mg tabs four times a day (every meal & bedtime) A  prescription for pain medication (such as percocet, oxycodone, hydrocodone, etc) should be  given to you upon discharge.  Take your pain medication as prescribed.  If you are having problems/concerns with the prescription medicine (does not control pain, nausea, vomiting, rash, itching, etc), please call us [phone] to see if we need to switch you to a different pain medicine that will work better for you and/or control your side effect better. If you need a refill on your pain medication, please contact your pharmacy.  They will contact our office to request authorization. Prescriptions will not be filled after 5 pm or on week-ends. KEEP YOUR BOWELS REGULAR and AVOID CONSTIPATION The goal is one to two soft bowel movements a day.  You should at least have a bowel movement every other day. Avoid getting constipated.  Between the surgery and the pain medications, it is common to experience some constipation. This can be very painful after rectal surgery.  Increasing fluid intake and taking a fiber supplement (such as Metamucil, Citrucel, FiberCon, etc) 1-2 times a day regularly will usually help prevent this problem from occurring.  A stool softener like colace is also recommended.  This can be purchased over the counter at your pharmacy.  You can take it up to 3 times a day.  If you do not have a bowel movement after 24 hrs since your surgery, take one does of milk of magnesia.  If you still haven't had a bowel movement 8-12 hours after that dose, take another dose.  If you don't have a bowel movement 48 hrs after surgery,   purchase a Fleets enema from the drug store and administer gently per package instructions.  If you still are having trouble with your bowel movements after that, please call the office for further instructions. If you develop diarrhea or have many loose bowel movements, simplify your diet to bland foods & liquids for a few days.  Stop any stool softeners and decrease your fiber supplement.  Switching to mild anti-diarrheal medications (Kayopectate, Pepto Bismol) can help.   If this worsens or does not improve, please call us.  Wound Care Remove your bandages before your first bowel movement or 8 hours after surgery.     Remove any wound packing material at this tim,e as well.  You do not need to repack the wound unless instructed otherwise.  Wear an absorbent pad or soft cotton gauze in your underwear to catch any drainage and help keep the area clean. You should change this every 2-3 hours while awake. Keep the area clean and dry.  Bathe / shower every day, especially after bowel movements.  Keep the area clean by showering / bathing over the incision / wound.   It is okay to soak an open wound to help wash it.  Wet wipes or showers / gentle washing after bowel movements is often less traumatic than regular toilet paper. You may have some styrofoam-like soft packing in the rectum which will come out with the first bowel movement.  You will often notice bleeding with bowel movements.  This should slow down by the end of the first week of surgery Expect some drainage.  This should slow down, too, by the end of the first week of surgery.  Wear an absorbent pad or soft cotton gauze in your underwear until the drainage stops. Do Not sit on a rubber or pillow ring.  This can make you symptoms worse.  You may sit on a soft pillow if needed.  ACTIVITIES as tolerated:   You may resume regular (light) daily activities beginning the next day--such as daily self-care, walking, climbing stairs--gradually increasing activities as tolerated.  If you can walk 30 minutes without difficulty, it is safe to try more intense activity such as jogging, treadmill, bicycling, low-impact aerobics, swimming, etc. Save the most intensive and strenuous activity for last such as sit-ups, heavy lifting, contact sports, etc  Refrain from any heavy lifting or straining until you are off narcotics for pain control.   You may drive when you are no longer taking prescription pain medication, you can  comfortably sit for long periods of time, and you can safely maneuver your car and apply brakes. You may have sexual intercourse when it is comfortable.  FOLLOW UP in our office Please call [facility] at [phone] to set up an appointment to see your surgeon in the office for a follow-up appointment approximately 3-4 weeks after your surgery. Make sure that you call for this appointment the day you arrive home to insure a convenient appointment time. 10. IF YOU HAVE DISABILITY OR FAMILY LEAVE FORMS, BRING THEM TO THE OFFICE FOR PROCESSING.  DO NOT GIVE THEM TO YOUR DOCTOR.     WHEN TO CALL us [phone]: Poor pain control Reactions / problems with new medications (rash/itching, nausea, etc)  Fever over 101.5 F (38.5 C) Inability to urinate Nausea and/or vomiting Worsening swelling or bruising Continued bleeding from incision. Increased pain, redness, or drainage from the incision  The clinic staff is available to answer your questions during regular business hours (8:30am-5pm).  Please don't hesitate to call and ask to speak to one of our nurses for clinical concerns.   A surgeon from [facility] is always on call at the hospitals   If you have a medical emergency, go to the nearest emergency room or call 911.    [facility], [address], [place], [place]  [address] ? MAIN: [phone] ? TOLL FREE: [phone] ? FAX [phone] [email or web address]    Post Anesthesia Home Care Instructions  Activity: Get plenty of rest for the remainder of the day. A responsible individual must stay with you for 24 hours following the procedure.  For the next 24 hours, DO NOT: -Drive a car -Paediatric nurse -Drink alcoholic beverages -Take any medication unless instructed by your physician -Make any legal decisions or sign important papers.  Meals: Start with liquid foods such as gelatin or soup. Progress to regular foods  as tolerated. Avoid greasy, spicy, heavy foods. If nausea and/or vomiting occur, drink only clear liquids until the nausea and/or vomiting subsides. Call your physician if vomiting continues.  Special Instructions/Symptoms: Your throat may feel dry or sore from the anesthesia or the breathing tube placed in your throat during surgery. If this causes discomfort, gargle with warm salt water. The discomfort should disappear within 24 hours.     Information for Discharge Teaching: EXPAREL (bupivacaine liposome injectable suspension)   Your surgeon or anesthesiologist gave you EXPAREL(bupivacaine) to help control your pain after surgery.  EXPAREL is a local anesthetic that provides pain relief by numbing the tissue around the surgical site. EXPAREL is designed to release pain medication over time and can control pain for up to 72 hours. Depending on how you respond to EXPAREL, you may require less pain medication during your recovery.  Possible side effects: Temporary loss of sensation or ability to move in the area where bupivacaine was injected. Nausea, vomiting, constipation Rarely, numbness and tingling in your mouth or lips, lightheadedness, or anxiety may occur. Call your doctor right away if you think you may be experiencing any of these sensations, or if you have other questions regarding possible side effects.  Follow all other discharge instructions given to you by your surgeon or nurse. Eat a healthy diet and drink plenty of water or other fluids.  If you return to the hospital for any reason within 96 hours following the administration of EXPAREL, it is important for health care providers to know that you have received this anesthetic. A teal colored band has been placed on your arm with the date, time and amount of EXPAREL you have received in order to alert and inform your health care providers. Please leave this armband in place for the full 96 hours following administration, and then  you may remove the band.   You were given Tylenol by mouth this morning before your Surgical Procedure at 0630hrs, please do not take any more Tylenol until at 12:30pm today No ibuprofen, Advil, Aleve, Motrin, ketorolac, meloxicam, naproxen, or other NSAIDS until after 1230 pm today if needed.

## 2022-09-12 NOTE — Transfer of Care (Signed)
Immediate Anesthesia Transfer of Care Note  Patient: Judy Skinner  Procedure(s) Performed: TRANSANAL HEMORRHOIDAL DEARTERIALIZATION  Patient Location: PACU  Anesthesia Type:MAC  Level of Consciousness: awake, alert , oriented, and patient cooperative  Airway & Oxygen Therapy: Patient Spontanous Breathing  Post-op Assessment: Report given to RN and Post -op Vital signs reviewed and stable  Post vital signs: Reviewed and stable  Last Vitals:  Vitals Value Taken Time  BP    Temp    Pulse 79 09/12/22 0851  Resp 23 09/12/22 0851  SpO2 95 % 09/12/22 0851  Vitals shown include unvalidated device data.  Last Pain:  Vitals:   09/12/22 0615  TempSrc: Oral  PainSc: 1       Patients Stated Pain Goal: 3 ([id number])  Complications: No notable events documented.

## 2022-09-12 NOTE — Op Note (Addendum)
09/12/2022  8:38 AM  PATIENT:  Judy Skinner  32 y.o. female  Patient Care Team: Renee Rival, FNP as PCP - General (Nurse Practitioner)  PRE-OPERATIVE DIAGNOSIS:  GRADE 2 BLEEDING HEMORRHOIDS  POST-OPERATIVE DIAGNOSIS:  GRADE 3 BLEEDING HEMORRHOIDS  PROCEDURE:  TRANSANAL HEMORRHOIDAL DEARTERIALIZATION   Surgeon(s): Leighton Ruff, MD  ASSISTANT: none   ANESTHESIA:   local and MAC  EBL:  Total I/O In: 800 [I.V.:800] Out: -   DRAINS: none   SPECIMEN:  No Specimen  DISPOSITION OF SPECIMEN:  N/A  COUNTS:  YES  PLAN OF CARE: Discharge to home after PACU  PATIENT DISPOSITION:  PACU - hemodynamically stable.  INDICATION: 32 y.o. F with large grade 3 hemorrhoids   OR FINDINGS: grade 3 hemorrhoids at all 3 locations.  Redundant rectal mucosa  Description: Informed consent was confirmed. Patient underwent general anesthesia without difficulty. Patient was placed into lithotomy positioning.  The perianal region was prepped and draped in sterile fashion. Surgical time out confirmed or plan.  I did digital rectal examination and then transitioned over to anoscopy to get a sense of the anatomy.  I switched over to the [facility] fiberoptically lit Doppler anocope.   Using the Doppler on the tip of the [place] anoscope, I identified the arterial hemorrhoidal vessels coming in in the classic hexagonal anatomical pattern (right posterior/lateral/anterior, left posterior /lateral/anterior).    I proceeded to ligate the hemorrhoidal arteries. I used a 2-0 Vicryl suture on a UR-6 needle in a figure-of-eight fashion over the signal around 6 cm proximal to the anal verge. I then ran that stitch longitudinally more distally to the dentate line. I then tied that stitch down to cause a hemorrhoidopexy. I did that for all 6 locations.    I redid Doppler anoscopy. I Identified a signal at the R anterior location.  I isolated and ligated this with a figure-of-eight stitch. Signals went away.  At  completion of this, all hemorrhoids were reduced into the rectum.  There is no more prolapse. External anatomy looked normal.  I repeated anoscopy and examination.  Hemostasis was good. I placed a soft Gelfoam cylinder into the rectum. Patient is being extubated go to recovery room.  I am about to discuss the patient's status to the family.    Rosario Adie, MD  Colorectal and [facility] [facility]

## 2022-09-13 ENCOUNTER — Encounter (HOSPITAL_BASED_OUTPATIENT_CLINIC_OR_DEPARTMENT_OTHER): Payer: Self-pay | Admitting: General Surgery

## 2023-03-10 ENCOUNTER — Ambulatory Visit: Payer: Self-pay | Admitting: Nurse Practitioner

## 2023-03-17 ENCOUNTER — Ambulatory Visit: Payer: Self-pay | Admitting: Nurse Practitioner
